# Patient Record
Sex: Male | Born: 1995 | Race: Black or African American | Hispanic: No
Health system: Southern US, Community
[De-identification: ages and names within clinical notes are randomized; demographics above are authoritative.]

## PROBLEM LIST (undated history)

## (undated) DIAGNOSIS — F419 Anxiety disorder, unspecified: Secondary | ICD-10-CM

## (undated) HISTORY — PX: NO PAST SURGERIES: SHX2092

---
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
## 2015-09-23 ENCOUNTER — Encounter: Payer: Self-pay | Admitting: Emergency Medicine

## 2015-09-23 ENCOUNTER — Emergency Department
Admission: EM | Admit: 2015-09-23 | Discharge: 2015-09-23 | Disposition: A | Discharge status: Home / Self Care | Payer: Self-pay | Attending: Emergency Medicine | Admitting: Emergency Medicine

## 2015-09-23 DIAGNOSIS — F17200 Nicotine dependence, unspecified, uncomplicated: Secondary | ICD-10-CM | POA: Insufficient documentation

## 2015-09-23 DIAGNOSIS — B349 Viral infection, unspecified: Secondary | ICD-10-CM | POA: Insufficient documentation

## 2015-09-23 LAB — POCT RAPID STREP A: STREPTOCOCCUS, GROUP A SCREEN (DIRECT): NEGATIVE

## 2015-09-23 LAB — MONONUCLEOSIS SCREEN: MONO SCREEN: NEGATIVE

## 2015-09-23 NOTE — ED Notes (Signed)
Chills  And body aches today  Afebrile on arrival to room.. Sore throat   Able to swallow well

## 2015-09-23 NOTE — ED Provider Notes (Signed)
[facility] Emergency Department Provider Note   ____________________________________________  Time seen: Approximately 5:39 PM  I have reviewed the triage vital signs and the nursing notes.   HISTORY  Chief Complaint Sore Throat   HPI Marcha Soldersyrone Macchia is a 20 y.o. male is here with complaint of sore throat that began last evening. Patient also states he had temperature the 100 last evening. His also had body aches and chills. He states that he is just not sure of fatigue but has "not felt like doing anything". He denies being around anyone with strep throat. He denies any upper respiratory symptoms, no nausea or vomiting, no diarrhea.He rates his pain as a 8 out of 10.   History reviewed. No pertinent past medical history.  There are no active problems to display for this patient.   History reviewed. No pertinent past surgical history.  No current outpatient prescriptions on file.  Allergies Review of patient's allergies indicates no known allergies.  No family history on file.  Social History Social History  Substance Use Topics  . Smoking status: Current Some Day Smoker  . Smokeless tobacco: None  . Alcohol Use: No    Review of Systems Constitutional: Positive fever/positive chills Eyes: No visual changes. ENT: Positive sore throat. Cardiovascular: Denies chest pain. Respiratory: Denies shortness of breath. Gastrointestinal: No abdominal pain.  No nausea, no vomiting.  No diarrhea.  No constipation. Genitourinary: Negative for dysuria. Musculoskeletal: Negative for back pain. Skin: Negative for rash. Neurological: Negative for headaches, focal weakness   10-point ROS otherwise negative.  ____________________________________________   PHYSICAL EXAM:  VITAL SIGNS: ED Triage Vitals  Enc Vitals Group     BP 09/23/15 1721 152/100 mmHg     Pulse Rate 09/23/15 1721 89     Resp 09/23/15 1721 20     Temp 09/23/15 1721 98.3 F (36.8  C)     Temp Source 09/23/15 1721 Oral     SpO2 09/23/15 1721 99 %     Weight 09/23/15 1721 168 lb (76.204 kg)     Height 09/23/15 1721 6\' 2"  (1.88 m)     Head Cir --      Peak Flow --      Pain Score 09/23/15 1723 8     Pain Loc --      Pain Edu? --      Excl. in GC? --     Constitutional: Alert and oriented. Well appearing and in no acute distress. Eyes: Conjunctivae are normal. PERRL. EOMI. Head: Atraumatic. Nose: No congestion/rhinnorhea.EACs and TMs are clear bilaterally. Mouth/Throat: Mucous membranes are moist.  Oropharynx Minimal erythema with right tonsil larger than the left. No exudate was noted. Neck: No stridor.   Hematological/Lymphatic/Immunilogical: Mild bilateral cervical lymphadenopathy. Cardiovascular: Normal rate, regular rhythm. Grossly normal heart sounds.  Good peripheral circulation. Respiratory: Normal respiratory effort.  No retractions. Lungs CTAB. Musculoskeletal: No lower extremity tenderness nor edema.  No joint effusions. Neurologic:  Normal speech and language. No gross focal neurologic deficits are appreciated. No gait instability. Skin:  Skin is warm, dry and intact. No rash noted. Psychiatric: Mood and affect are normal. Speech and behavior are normal.  ____________________________________________   LABS (all labs ordered are listed, but only abnormal results are displayed)  Labs Reviewed  MONONUCLEOSIS SCREEN  POCT RAPID STREP A     PROCEDURES  Procedure(s) performed: None  Critical Care performed: No  ____________________________________________   INITIAL IMPRESSION / ASSESSMENT AND PLAN / ED COURSE  Pertinent labs &  imaging results that were available during my care of the patient were reviewed by me and considered in my medical decision making (see chart for details).  A strep a mono test were negative. Patient was told this most likely is viral illness and to take ibuprofen or Tylenol as needed for fever and body aches. He is  to follow-up with [facility] clinic if any continued problems. ____________________________________________   FINAL CLINICAL IMPRESSION(S) / ED DIAGNOSES  Final diagnoses:  Viral illness      NEW MEDICATIONS STARTED DURING THIS VISIT:  New Prescriptions   No medications on file     Note:  This document was prepared using Dragon voice recognition software and may include unintentional dictation errors.    Tommi Rumps, PA-C 09/23/15 1853  Loleta Rose, MD 09/23/15 2113

## 2015-09-23 NOTE — ED Notes (Signed)
Chills and body aches and sore throat since yesterday.

## 2015-09-23 NOTE — Discharge Instructions (Signed)
Tylenol or ibuprofen if needed for fever or body aches. Increase fluids. Follow[facility] if any severe worsening of your problems or urgent concerns.

## 2017-03-08 ENCOUNTER — Emergency Department
Admission: EM | Admit: 2017-03-08 | Discharge: 2017-03-08 | Disposition: A | Discharge status: Home / Self Care | Payer: Self-pay | Attending: Emergency Medicine | Admitting: Emergency Medicine

## 2017-03-08 ENCOUNTER — Emergency Department: Payer: Self-pay

## 2017-03-08 DIAGNOSIS — F17200 Nicotine dependence, unspecified, uncomplicated: Secondary | ICD-10-CM | POA: Insufficient documentation

## 2017-03-08 DIAGNOSIS — R079 Chest pain, unspecified: Secondary | ICD-10-CM | POA: Insufficient documentation

## 2017-03-08 LAB — BASIC METABOLIC PANEL
ANION GAP: 9 (ref 5–15)
BUN: 15 mg/dL (ref 6–20)
CHLORIDE: 104 mmol/L (ref 101–111)
CO2: 25 mmol/L (ref 22–32)
Calcium: 10 mg/dL (ref 8.9–10.3)
Creatinine, Ser: 1.2 mg/dL (ref 0.61–1.24)
GFR calc Af Amer: >60 mL/min (ref >60)
GLUCOSE: 163 mg/dL — AB (ref 65–99)
POTASSIUM: 3.3 mmol/L — AB (ref 3.5–5.1)
Sodium: 138 mmol/L (ref 135–145)

## 2017-03-08 LAB — CBC
HEMATOCRIT: 46.6 % (ref 40.0–52.0)
HEMOGLOBIN: 15.6 g/dL (ref 13.0–18.0)
MCH: 29.7 pg (ref 26.0–34.0)
MCHC: 33.5 g/dL (ref 32.0–36.0)
MCV: 88.5 fL (ref 80.0–100.0)
Platelets: 293 10*3/uL (ref 150–440)
RBC: 5.26 MIL/uL (ref 4.40–5.90)
RDW: 13.5 % (ref 11.5–14.5)
WBC: 7.9 10*3/uL (ref 3.8–10.6)

## 2017-03-08 LAB — FIBRIN DERIVATIVES D-DIMER (ARMC ONLY): FIBRIN DERIVATIVES D-DIMER (ARMC): 83.55 ng{FEU}/mL (ref 0.00–499.00)

## 2017-03-08 LAB — TROPONIN I: Troponin I: <0.03 ng/mL (ref <0.03)

## 2017-03-08 NOTE — ED Notes (Signed)
ED Provider at bedside. 

## 2017-03-08 NOTE — ED Notes (Signed)
Pt states "vein in my hand was sore yesterday, today the vein in my neck hurts"

## 2017-03-08 NOTE — ED Triage Notes (Signed)
Left sided intermittent chest pain and sharpness for the last month. No pain at this time. Pain did occur earlier today. Pt alert and oriented X4, active, cooperative, pt in NAD. RR even and unlabored, color WNL.

## 2017-03-08 NOTE — ED Provider Notes (Signed)
[facility] Emergency Department Provider Note  ____________________________________________   First MD Initiated Contact with Patient 03/08/17 1932     (approximate)  I have reviewed the triage vital signs and the nursing notes.   HISTORY  Chief Complaint Chest Pain   HPI Jeremy Kim is a 21 y.o. male without any chronic medical conditions was presenting with at least 1 month of chest pain. He says the chest pain is left-sided and "grabs him." He says that the pain lasts for about 30 seconds and then goes away. He sometimes experiences shortness of breath with pain. He says that he has not done any more having lifting than his normal routine which is in [employer]. He says he lifts 30 pound windows and has not done anything heavier than this in the recent past. He denies any history of blood clots. Says his pain is minimal at this time. Denies any worsening factors such as movement or exertion. Denies any radiation of the pain. However, he does have that the pain sometimes on the right side of his chest as well.   History reviewed. No pertinent past medical history.  There are no active problems to display for this patient.   History reviewed. No pertinent surgical history.  Prior to Admission medications   Not on File    Allergies Patient has no known allergies.  No family history on file.  Social History Social History  Substance Use Topics  . Smoking status: Current Some Day Smoker  . Smokeless tobacco: Not on file  . Alcohol use No    Review of Systems  Constitutional: No fever/chills Eyes: No visual changes. ENT: No sore throat. Cardiovascular:as above Respiratory: as above Gastrointestinal: No abdominal pain.  No nausea, no vomiting.  No diarrhea.  No constipation. Genitourinary: Negative for dysuria. Musculoskeletal: Negative for back pain. Skin: Negative for rash. Neurological: Negative for headaches, focal weakness or  numbness.   ____________________________________________   PHYSICAL EXAM:  VITAL SIGNS: ED Triage Vitals  Enc Vitals Group     BP 03/08/17 1752 130/75     Pulse Rate 03/08/17 1752 (!) 109     Resp 03/08/17 1752 16     Temp 03/08/17 1752 98.8 F (37.1 C)     Temp Source 03/08/17 1752 Oral     SpO2 03/08/17 1752 98 %     Weight 03/08/17 1753 180 lb (81.6 kg)     Height 03/08/17 1753 6' (1.829 m)     Head Circumference --      Peak Flow --      Pain Score 03/08/17 1935 3     Pain Loc --      Pain Edu? --      Excl. in GC? --     Constitutional: Alert and oriented. Well appearing and in no acute distress. Eyes: Conjunctivae are normal.  Head: Atraumatic. Nose: No congestion/rhinnorhea. Mouth/Throat: Mucous membranes are moist.  Neck: No stridor.   Cardiovascular: Normal rate, regular rhythm. Grossly normal heart sounds.  Good peripheral circulation with intact equal and bilateral radial as well as dorsalis pedis pulses. Chest pain is not reproducible to palpation. Respiratory: Normal respiratory effort.  No retractions. Lungs CTAB. Gastrointestinal: Soft and nontender. No distention. Musculoskeletal: No lower extremity tenderness nor edema.  No joint effusions. Neurologic:  Normal speech and language. No gross focal neurologic deficits are appreciated. Skin:  Skin is warm, dry and intact. No rash noted. Psychiatric: Mood and affect are normal. Speech and behavior are  normal.  ____________________________________________   LABS (all labs ordered are listed, but only abnormal results are displayed)  Labs Reviewed  BASIC METABOLIC PANEL - Abnormal; Notable for the following:       Result Value   Potassium 3.3 (*)    Glucose, Bld 163 (*)    All other components within normal limits  CBC  TROPONIN I  FIBRIN DERIVATIVES D-DIMER ([facility] ONLY)   ____________________________________________  EKG  ED ECG REPORT I, Arelia Longest, the attending physician, personally  viewed and interpreted this ECG.   Date: 03/08/2017  EKG Time: 1749  Rate: 78  Rhythm: normal sinus rhythm  Axis: Normal  Intervals:none  ST&T Change: no ST segment elevation or depression. No abnormal T-wave inversion.  ____________________________________________  RADIOLOGY  no acute processes on the chest x-ray. ____________________________________________   PROCEDURES  Procedure(s) performed:   Procedures  Critical Care performed:   ____________________________________________   INITIAL IMPRESSION / ASSESSMENT AND PLAN / ED COURSE  Pertinent labs & imaging results that were available during my care of the patient were reviewed by me and considered in my medical decision making (see chart for details).  Differential diagnosis includes, but is not limited to, ACS, aortic dissection, pulmonary embolism, cardiac tamponade, pneumothorax, pneumonia, pericarditis/myocarditis, GI-related causes including esophagitis/gastritis, and musculoskeletal chest wall pain.    As part of my medical decision making, I reviewed the following data within the electronic MEDICAL RECORD NUMBER Notes from prior ED visits     ----------------------------------------- 9:17 PM on 03/08/2017 -----------------------------------------  Patient without any distress at this time. Resting comfortably without any complaints. Likely musculoskeletal issue. 1 month of symptoms with very reassuring lab work and presentation. Discussed with the patient using ibuprofen, Tylenol as well as topical salve such as icy hot. Also discussed stretching the chest muscles. He is understanding the plan and willing to comply.  ____________________________________________   FINAL CLINICAL IMPRESSION(S) / ED DIAGNOSES  chest pain.    NEW MEDICATIONS STARTED DURING THIS VISIT:  New Prescriptions   No medications on file     Note:  This document was prepared using Dragon voice recognition software and may include  unintentional dictation errors.     Myrna Blazer, MD 03/08/17 2117

## 2017-03-10 ENCOUNTER — Emergency Department
Admission: EM | Admit: 2017-03-10 | Discharge: 2017-03-10 | Disposition: A | Discharge status: Home / Self Care | Payer: Self-pay | Attending: Emergency Medicine | Admitting: Emergency Medicine

## 2017-03-10 ENCOUNTER — Encounter: Payer: Self-pay | Admitting: Emergency Medicine

## 2017-03-10 ENCOUNTER — Other Ambulatory Visit: Payer: Self-pay

## 2017-03-10 DIAGNOSIS — R69 Illness, unspecified: Secondary | ICD-10-CM | POA: Insufficient documentation

## 2017-03-10 DIAGNOSIS — J111 Influenza due to unidentified influenza virus with other respiratory manifestations: Secondary | ICD-10-CM

## 2017-03-10 DIAGNOSIS — F17210 Nicotine dependence, cigarettes, uncomplicated: Secondary | ICD-10-CM | POA: Insufficient documentation

## 2017-03-10 DIAGNOSIS — J039 Acute tonsillitis, unspecified: Secondary | ICD-10-CM | POA: Insufficient documentation

## 2017-03-10 LAB — CBC
HCT: 45.2 % (ref 40.0–52.0)
HEMOGLOBIN: 14.9 g/dL (ref 13.0–18.0)
MCH: 29.1 pg (ref 26.0–34.0)
MCHC: 33 g/dL (ref 32.0–36.0)
MCV: 88.3 fL (ref 80.0–100.0)
PLATELETS: 272 10*3/uL (ref 150–440)
RBC: 5.12 MIL/uL (ref 4.40–5.90)
RDW: 13.7 % (ref 11.5–14.5)
WBC: 23.7 10*3/uL — ABNORMAL HIGH (ref 3.8–10.6)

## 2017-03-10 LAB — COMPREHENSIVE METABOLIC PANEL
ALBUMIN: 4.5 g/dL (ref 3.5–5.0)
ALK PHOS: 49 U/L (ref 38–126)
ALT: 15 U/L — ABNORMAL LOW (ref 17–63)
ANION GAP: 9 (ref 5–15)
AST: 29 U/L (ref 15–41)
BILIRUBIN TOTAL: 1.3 mg/dL — AB (ref 0.3–1.2)
BUN: 11 mg/dL (ref 6–20)
CALCIUM: 9.5 mg/dL (ref 8.9–10.3)
CO2: 24 mmol/L (ref 22–32)
CREATININE: 1.22 mg/dL (ref 0.61–1.24)
Chloride: 105 mmol/L (ref 101–111)
GFR calc non Af Amer: >60 mL/min (ref >60)
GLUCOSE: 110 mg/dL — AB (ref 65–99)
Potassium: 3.7 mmol/L (ref 3.5–5.1)
Sodium: 138 mmol/L (ref 135–145)
TOTAL PROTEIN: 7.7 g/dL (ref 6.5–8.1)

## 2017-03-10 LAB — INFLUENZA PANEL BY PCR (TYPE A & B)
INFLBPCR: NEGATIVE
Influenza A By PCR: NEGATIVE

## 2017-03-10 LAB — MONONUCLEOSIS SCREEN: Mono Screen: NEGATIVE

## 2017-03-10 LAB — TYPE AND SCREEN
ABO/RH(D): A POS
ANTIBODY SCREEN: NEGATIVE

## 2017-03-10 LAB — POCT RAPID STREP A: STREPTOCOCCUS, GROUP A SCREEN (DIRECT): NEGATIVE

## 2017-03-10 MED ORDER — NAPROXEN 500 MG PO TABS: 500.0000 mg | ORAL_TABLET | Freq: Two times a day (BID) | ORAL | 0 refills | Status: DC

## 2017-03-10 MED ORDER — DEXAMETHASONE SODIUM PHOSPHATE 10 MG/ML IJ SOLN
10.0000 mg | Freq: Once | INTRAMUSCULAR | Status: AC
  Administered 2017-03-10: 10 mg via INTRAVENOUS
  Filled 2017-03-10: qty 1

## 2017-03-10 MED ORDER — SODIUM CHLORIDE 0.9 % IV BOLUS (SEPSIS)
1000.0000 mL | Freq: Once | INTRAVENOUS | Status: AC
  Administered 2017-03-10: 1000 mL via INTRAVENOUS

## 2017-03-10 MED ORDER — KETOROLAC TROMETHAMINE 30 MG/ML IJ SOLN
15.0000 mg | INTRAMUSCULAR | Status: AC
Start: 2017-03-10 — End: 2017-03-10
  Administered 2017-03-10: 15 mg via INTRAVENOUS
  Filled 2017-03-10: qty 1

## 2017-03-10 NOTE — Discharge Instructions (Signed)
Your strep, mono, and flu tests were all negative.  This is most likely another viral illness.  If your symptoms worsen or you are unable to drink fluids, return to the ER for repeat evaluation.

## 2017-03-10 NOTE — ED Provider Notes (Signed)
[facility] Emergency Department Provider Note  ____________________________________________  Time seen: Approximately 2:24 PM  I have reviewed the triage vital signs and the nursing notes.   HISTORY  Chief Complaint Hemoptysis; Back Pain; and Sore Throat    HPI Jeremy Kim is a 21 y.o. male reports having a sore throat for the past 2 days. Initially he was having some discomfort in his chest, and then last night his symptoms became more per dominantly sore throat. Also reports body aches and fatigue. No fevers but he did have chills yesterday. Denies any medical history allergies medications or prior surgeries.  This morning after brushing his teeth, he is trying to clear his throat and he spit out some blood tinged sputum a few times and one single episode. Denies any cough whatsoever. Denies any vomiting. Denies any other bleeding. No epistaxis. No dizziness or syncope.  he is around small children at home on a regular basis.   History reviewed. No pertinent past medical history.   There are no active problems to display for this patient.    History reviewed. No pertinent surgical history.   Prior to Admission medications   Medication Sig Start Date End Date Taking? Authorizing Provider  naproxen (NAPROSYN) 500 MG tablet Take 1 tablet (500 mg total) by mouth 2 (two) times daily with a meal. 03/10/17   Sharman CheekStafford, Kyung Muto, MD  none   Allergies Patient has no known allergies.   History reviewed. No pertinent family history.  Social History Social History  Substance Use Topics  . Smoking status: Current Some Day Smoker    Types: Cigarettes  . Smokeless tobacco: Never Used     Comment: 1 or 2 per day  . Alcohol use No    Review of Systems  Constitutional:   No fever positive chills.  ENT:   Positive sore throat. No rhinorrhea. Cardiovascular:   No chest pain or syncope. Respiratory:   No dyspnea or cough. Gastrointestinal:   Negative  for abdominal pain, vomiting and diarrhea.  Musculoskeletal:   positive body aches, most predominant in the lower back. All other systems reviewed and are negative except as documented above in ROS and HPI.  ____________________________________________   PHYSICAL EXAM:  VITAL SIGNS: ED Triage Vitals  Enc Vitals Group     BP 03/10/17 1215 130/83     Pulse Rate 03/10/17 1215 (!) 108     Resp 03/10/17 1215 14     Temp 03/10/17 1215 99.2 F (37.3 C)     Temp Source 03/10/17 1215 Oral     SpO2 03/10/17 1215 98 %     Weight 03/10/17 1215 180 lb (81.6 kg)     Height 03/10/17 1215 6' (1.829 m)     Head Circumference --      Peak Flow --      Pain Score 03/10/17 1224 7     Pain Loc --      Pain Edu? --      Excl. in GC? --     Vital signs reviewed, nursing assessments reviewed.   Constitutional:   Alert and oriented. Well appearing and in no distress. Eyes:   No scleral icterus.  EOMI. No nystagmus. No conjunctival pallor. PERRL. ENT   Head:   Normocephalic and atraumatic.   Nose:   No congestion/rhinnorhea.    Mouth/Throat:   MMM, positive pharyngeal erythema. Bilateral swollen tonsils with exudates. No peritonsillar mass or uvular deviation.    Neck:   No meningismus. Full  ROM.no midline tenderness Hematological/Lymphatic/Immunilogical:   large bilateral cervical lymphadenopathy. Cardiovascular:   tachycardia heart rate 110. Symmetric bilateral radial and DP pulses.  No murmurs.  Respiratory:   Normal respiratory effort without tachypnea/retractions. Breath sounds are clear and equal bilaterally. No wheezes/rales/rhonchi. Gastrointestinal:   Soft and nontender. Non distended. There is no CVA tenderness.  No rebound, rigidity, or guarding. Genitourinary:   deferred Musculoskeletal:   Normal range of motion in all extremities. No joint effusions.  No lower extremity tenderness.  No edema.no midline spinal tenderness. Neurologic:   Normal speech and language.  Motor  grossly intact. No gross focal neurologic deficits are appreciated.  Skin:    Skin is warm, dry and intact. No rash noted.  No petechiae, purpura, or bullae.  ____________________________________________    LABS (pertinent positives/negatives) (all labs ordered are listed, but only abnormal results are displayed) Labs Reviewed  COMPREHENSIVE METABOLIC PANEL - Abnormal; Notable for the following:       Result Value   Glucose, Bld 110 (*)    ALT 15 (*)    Total Bilirubin 1.3 (*)    All other components within normal limits  CBC - Abnormal; Notable for the following:    WBC 23.7 (*)    All other components within normal limits  CULTURE, GROUP A STREP ([facility])  INFLUENZA PANEL BY PCR (TYPE A & B)  MONONUCLEOSIS SCREEN  POC OCCULT BLOOD, ED  TYPE AND SCREEN   ____________________________________________   EKG  interpreted by me Normal sinus rhythm rate of 96, normal axis and intervals. Normal QRS ST segments and T waves. Incomplete right bundle-branch block.  ____________________________________________    RADIOLOGY  No results found.  ____________________________________________   PROCEDURES Procedures  ____________________________________________   DIFFERENTIAL DIAGNOSIS  mononucleosis, strep pharyngitis, influenza, viral URI  CLINICAL IMPRESSION / ASSESSMENT AND PLAN / ED COURSE  Pertinent labs & imaging results that were available during my care of the patient were reviewed by me and considered in my medical decision making (see chart for details).   no evidence of epiglottitis, bacterial tracheitis, peritonsillar abscess, retropharyngeal abscess. Nothing to suggest PE sepsis meningitis encephalitis. Patient is overall well appearing nontoxic, unremarkable vital signs except for mild tachycardia. Given IV fluids, Decadron, Toradol. Check mono, flu, strep culture.      ----------------------------------------- 3:06 PM on  03/10/2017 -----------------------------------------  Heart rate improved to 95. Patient is tolerating oral intake, calm comfortable smiling and energetic. He was to go home. Workup is negative except for his leukocytosis. Return precautions discussed for any worsening of his condition but particularly any difficulty tolerating oral intake or fever or focal pain. He is not a risk of epidural abscess or discitis or osteomyelitis of the spine. Follow-up with primary care.  ____________________________________________   FINAL CLINICAL IMPRESSION(S) / ED DIAGNOSES    Final diagnoses:  Tonsillitis  Influenza-like illness      New Prescriptions   NAPROXEN (NAPROSYN) 500 MG TABLET    Take 1 tablet (500 mg total) by mouth 2 (two) times daily with a meal.     Portions of this note were generated with dragon dictation software. Dictation errors may occur despite best attempts at proofreading.    Sharman Cheek, MD 03/10/17 [phone]

## 2017-03-10 NOTE — ED Triage Notes (Signed)
Pt via pov from home with "spitting up blood" x 3 this morning, chills, and lower back pain. Pt states he broke his back last year and "sometimes" has pain. He states he has tingling from waist down. Denies urinary symptoms or incontinence. Pt states he has had a sore throat since last night as well. Pt alert & oriented with NAD  Noted.

## 2017-03-13 LAB — CULTURE, GROUP A STREP (THRC)

## 2017-04-10 ENCOUNTER — Other Ambulatory Visit: Payer: Self-pay

## 2017-04-10 ENCOUNTER — Emergency Department
Admission: EM | Admit: 2017-04-10 | Discharge: 2017-04-10 | Disposition: A | Discharge status: Home / Self Care | Payer: Self-pay | Attending: Emergency Medicine | Admitting: Emergency Medicine

## 2017-04-10 ENCOUNTER — Emergency Department: Payer: Self-pay

## 2017-04-10 ENCOUNTER — Encounter: Payer: Self-pay | Admitting: Emergency Medicine

## 2017-04-10 DIAGNOSIS — Y9389 Activity, other specified: Secondary | ICD-10-CM | POA: Insufficient documentation

## 2017-04-10 DIAGNOSIS — Y929 Unspecified place or not applicable: Secondary | ICD-10-CM | POA: Insufficient documentation

## 2017-04-10 DIAGNOSIS — S60221A Contusion of right hand, initial encounter: Secondary | ICD-10-CM | POA: Insufficient documentation

## 2017-04-10 DIAGNOSIS — Z79899 Other long term (current) drug therapy: Secondary | ICD-10-CM | POA: Insufficient documentation

## 2017-04-10 DIAGNOSIS — X58XXXA Exposure to other specified factors, initial encounter: Secondary | ICD-10-CM | POA: Insufficient documentation

## 2017-04-10 DIAGNOSIS — Y999 Unspecified external cause status: Secondary | ICD-10-CM | POA: Insufficient documentation

## 2017-04-10 DIAGNOSIS — F17210 Nicotine dependence, cigarettes, uncomplicated: Secondary | ICD-10-CM | POA: Insufficient documentation

## 2017-04-10 MED ORDER — TRAMADOL HCL 50 MG PO TABS: 50.0000 mg | ORAL_TABLET | Freq: Four times a day (QID) | ORAL | 0 refills | Status: DC | PRN

## 2017-04-10 MED ORDER — IBUPROFEN 600 MG PO TABS: 600.0000 mg | ORAL_TABLET | Freq: Three times a day (TID) | ORAL | 0 refills | Status: DC | PRN

## 2017-04-10 NOTE — Discharge Instructions (Signed)
Hand splint for 2-3 days as needed.

## 2017-04-10 NOTE — ED Provider Notes (Signed)
[facility] Emergency Department Provider Note   ____________________________________________   First MD Initiated Contact with Patient 04/10/17 1616     (approximate)  I have reviewed the triage vital signs and the nursing notes.   HISTORY  Chief Complaint Hand Pain    HPI Marcha Soldersyrone Violett is a 21 y.o. male patient complaining of right hand pain secondary to hitting a wall last night.Patient rates pain as 8/10. Patient described a pain as "acute achy". No palliative measures for her complaint. Patient is right-hand dominant.   History reviewed. No pertinent past medical history.  There are no active problems to display for this patient.   History reviewed. No pertinent surgical history.  Prior to Admission medications   Medication Sig Start Date End Date Taking? Authorizing Provider  ibuprofen (ADVIL,MOTRIN) 600 MG tablet Take 1 tablet (600 mg total) every 8 (eight) hours as needed by mouth. 04/10/17   Joni ReiningSmith, Zelma Mazariego K, PA-C  naproxen (NAPROSYN) 500 MG tablet Take 1 tablet (500 mg total) by mouth 2 (two) times daily with a meal. 03/10/17   Sharman CheekStafford, Phillip, MD  traMADol (ULTRAM) 50 MG tablet Take 1 tablet (50 mg total) every 6 (six) hours as needed by mouth for moderate pain. 04/10/17   Joni ReiningSmith, Kama Cammarano K, PA-C    Allergies Patient has no known allergies.  No family history on file.  Social History Social History   Tobacco Use  . Smoking status: Current Some Day Smoker    Types: Cigarettes  . Smokeless tobacco: Never Used  . Tobacco comment: 1 or 2 per day  Substance Use Topics  . Alcohol use: No  . Drug use: No    Review of Systems Constitutional: No fever/chills Eyes: No visual changes. ENT: No sore throat. Cardiovascular: Denies chest pain. Respiratory: Denies shortness of breath. Gastrointestinal: No abdominal pain.  No nausea, no vomiting.  No diarrhea.  No constipation. Genitourinary: Negative for dysuria. Musculoskeletal: Right  hand pain  Skin: Negative for rash. Neurological: Negative for headaches, focal weakness or numbness.   ____________________________________________   PHYSICAL EXAM:  VITAL SIGNS: ED Triage Vitals  Enc Vitals Group     BP 04/10/17 1552 135/74     Pulse Rate 04/10/17 1552 98     Resp 04/10/17 1552 20     Temp 04/10/17 1552 98.4 F (36.9 C)     Temp Source 04/10/17 1552 Oral     SpO2 04/10/17 1552 99 %     Weight 04/10/17 1555 186 lb (84.4 kg)     Height 04/10/17 1555 6' (1.829 m)     Head Circumference --      Peak Flow --      Pain Score 04/10/17 1552 8     Pain Loc --      Pain Edu? --      Excl. in GC? --     Constitutional: Alert and oriented. Well appearing and in no acute distress. Cardiovascular: Normal rate, regular rhythm. Grossly normal heart sounds.  Good peripheral circulation. Respiratory: Normal respiratory effort.  No retractions. Lungs CTAB. Gastrointestinal: Soft and nontender. No distention. No abdominal bruits. No CVA tenderness. Musculoskeletal: No obvious deformity to the right hand. It is moderate guarding palpation of the third to fifth metacarpal.  Neurologic:  Normal speech and language. No gross focal neurologic deficits are appreciated. No gait instability. Skin:  Skin is warm, dry and intact. No rash noted. Psychiatric: Mood and affect are normal. Speech and behavior are normal.  ____________________________________________  LABS (all labs ordered are listed, but only abnormal results are displayed)  Labs Reviewed - No data to display ____________________________________________  EKG   ____________________________________________  RADIOLOGY  Dg Hand Complete Right  Result Date: 04/10/2017 CLINICAL DATA:  Patient reports punching a wall today. Reports pain to medial surface of right hand and medial and posterior surfaces of right wrist. No previous injuries to right hand or wrist. EXAM: RIGHT HAND - COMPLETE 3+ VIEW COMPARISON:   None. FINDINGS: Osseous alignment is normal. Bone mineralization is normal. No fracture line or acute fracture fragment identified. Adjacent soft tissues are unremarkable. IMPRESSION: Negative. Electronically Signed   By: Bary RichardStan  Maynard M.D.   On: 04/10/2017 16:17    ____________________________________________   PROCEDURES  Procedure(s) performed: None  Procedures  Critical Care performed: No  ____________________________________________   INITIAL IMPRESSION / ASSESSMENT AND PLAN / ED COURSE  As part of my medical decision making, I reviewed the following data within the electronic MEDICAL RECORD NUMBER    Right hand pain secondary to contusion. Discussed negative x-ray findings with patient. Patient given discharge Instructions. Patient placed in a volar splint and advised take medication as directed. Patient given a work no. Patient advised to follow-up with the "clinic if condition persists.      ____________________________________________   FINAL CLINICAL IMPRESSION(S) / ED DIAGNOSES  Final diagnoses:  Contusion of right hand, initial encounter     ED Discharge Orders        Ordered    ibuprofen (ADVIL,MOTRIN) 600 MG tablet  Every 8 hours PRN     04/10/17 1630    traMADol (ULTRAM) 50 MG tablet  Every 6 hours PRN     04/10/17 1630       Note:  This document was prepared using Dragon voice recognition software and may include unintentional dictation errors.    Joni ReiningSmith, Careena Degraffenreid K, PA-C 04/10/17 1631    Jeanmarie PlantMcShane, James A, MD 04/10/17 2100

## 2017-04-10 NOTE — ED Triage Notes (Signed)
R hand pain. States injured it hitting a wall last night.

## 2017-10-14 ENCOUNTER — Encounter: Payer: Self-pay | Admitting: Emergency Medicine

## 2017-10-14 ENCOUNTER — Other Ambulatory Visit: Payer: Self-pay

## 2017-10-14 ENCOUNTER — Emergency Department
Admission: EM | Admit: 2017-10-14 | Discharge: 2017-10-14 | Disposition: A | Discharge status: Home / Self Care | Payer: Self-pay | Attending: Emergency Medicine | Admitting: Emergency Medicine

## 2017-10-14 DIAGNOSIS — F17210 Nicotine dependence, cigarettes, uncomplicated: Secondary | ICD-10-CM | POA: Insufficient documentation

## 2017-10-14 DIAGNOSIS — J029 Acute pharyngitis, unspecified: Secondary | ICD-10-CM

## 2017-10-14 MED ORDER — LIDOCAINE VISCOUS HCL 2 % MT SOLN: 10.0000 mL | OROMUCOSAL | 0 refills | Status: DC | PRN

## 2017-10-14 MED ORDER — AMOXICILLIN-POT CLAVULANATE 875-125 MG PO TABS: 1.0000 | ORAL_TABLET | Freq: Two times a day (BID) | ORAL | 0 refills | Status: AC

## 2017-10-14 MED ORDER — PREDNISONE 10 MG PO TABS: ORAL_TABLET | ORAL | 0 refills | Status: DC

## 2017-10-14 MED ORDER — DEXAMETHASONE SODIUM PHOSPHATE 10 MG/ML IJ SOLN
10.0000 mg | Freq: Once | INTRAMUSCULAR | Status: AC
  Administered 2017-10-14: 10 mg via INTRAMUSCULAR
  Filled 2017-10-14: qty 1

## 2017-10-14 MED ORDER — AMOXICILLIN-POT CLAVULANATE 875-125 MG PO TABS
1.0000 | ORAL_TABLET | Freq: Once | ORAL | Status: AC
  Administered 2017-10-14: 1 via ORAL
  Filled 2017-10-14: qty 1

## 2017-10-14 NOTE — ED Triage Notes (Signed)
Pt reports that he developed a sore throat last night reports that it hurts to swallow. He is able to swallow his saliva.

## 2017-10-14 NOTE — ED Provider Notes (Signed)
[facility] Emergency Department Provider Note  ____________________________________________  Time seen: Approximately 12:40 PM  I have reviewed the triage vital signs and the nursing notes.   HISTORY  Chief Complaint Sore Throat    HPI Jeremy Kim is a 22 y.o. male that presents to the emergency department for evaluation of sore throat for 1 day.  Patient states that it is painful to swallow.  No sick contacts.  No fever, chills, nausea, vomiting.   History reviewed. No pertinent past medical history.  There are no active problems to display for this patient.   History reviewed. No pertinent surgical history.  Prior to Admission medications   Medication Sig Start Date End Date Taking? Authorizing Provider  amoxicillin-clavulanate (AUGMENTIN) 875-125 MG tablet Take 1 tablet by mouth 2 (two) times daily for 10 days. 10/14/17 10/24/17  Enid Derry, PA-C  ibuprofen (ADVIL,MOTRIN) 600 MG tablet Take 1 tablet (600 mg total) every 8 (eight) hours as needed by mouth. 04/10/17   Joni Reining, PA-C  lidocaine (XYLOCAINE) 2 % solution Use as directed 10 mLs in the mouth or throat as needed for mouth pain. 10/14/17   Enid Derry, PA-C  naproxen (NAPROSYN) 500 MG tablet Take 1 tablet (500 mg total) by mouth 2 (two) times daily with a meal. 03/10/17   Sharman Cheek, MD  predniSONE (DELTASONE) 10 MG tablet Take 6 tablets on day 1, take 5 tablets on day 2, take 4 tablets on day 3, take 3 tablets on day 4, take 2 tablets on day 5, take 1 tablet on day 6 10/14/17   Enid Derry, PA-C  traMADol (ULTRAM) 50 MG tablet Take 1 tablet (50 mg total) every 6 (six) hours as needed by mouth for moderate pain. 04/10/17   Joni Reining, PA-C    Allergies Patient has no known allergies.  History reviewed. No pertinent family history.  Social History Social History   Tobacco Use  . Smoking status: Current Some Day Smoker    Types: Cigarettes  . Smokeless tobacco:  Never Used  . Tobacco comment: 1 or 2 per day  Substance Use Topics  . Alcohol use: No  . Drug use: No     Review of Systems  Constitutional: No fever/chills Eyes: No visual changes. No discharge. ENT: Negative for congestion and rhinorrhea. Cardiovascular: No chest pain. Respiratory: Negative for cough. No SOB. Gastrointestinal: No abdominal pain.  No nausea, no vomiting.  Musculoskeletal: Negative for musculoskeletal pain. Skin: Negative for rash, abrasions, lacerations, ecchymosis. Neurological: Negative for headaches.   ____________________________________________   PHYSICAL EXAM:  VITAL SIGNS: ED Triage Vitals  Enc Vitals Group     BP 10/14/17 1145 (!) 150/70     Pulse Rate 10/14/17 1145 96     Resp 10/14/17 1145 18     Temp 10/14/17 1145 98.6 F (37 C)     Temp Source 10/14/17 1145 Oral     SpO2 10/14/17 1145 100 %     Weight 10/14/17 1146 180 lb (81.6 kg)     Height 10/14/17 1146  (1.88 m)     Head Circumference --      Peak Flow --      Pain Score 10/14/17 1145 10     Pain Loc --      Pain Edu? --      Excl. in GC? --      Constitutional: Alert and oriented. Well appearing and in no acute distress. Eyes: Conjunctivae are normal. PERRL. EOMI. No  discharge. Head: Atraumatic. ENT: No frontal and maxillary sinus tenderness.      Ears: Tympanic membranes pearly gray with good landmarks. No discharge.      Nose: No congestion/rhinnorhea.      Mouth/Throat: Mucous membranes are moist. Oropharynx erythematous. Tonsils not enlarged. No exudates. Uvula midline and enlarged. Neck: No stridor.   Hematological/Lymphatic/Immunilogical: No cervical lymphadenopathy. Cardiovascular: Normal rate, regular rhythm.  Good peripheral circulation. Respiratory: Normal respiratory effort without tachypnea or retractions. Lungs CTAB. Good air entry to the bases with no decreased or absent breath sounds. Gastrointestinal: Bowel sounds 4 quadrants. Soft and nontender to  palpation. No guarding or rigidity. No palpable masses. No distention. Musculoskeletal: Full range of motion to all extremities. No gross deformities appreciated. Neurologic:  Normal speech and language. No gross focal neurologic deficits are appreciated.  Skin:  Skin is warm, dry and intact. No rash noted. Psychiatric: Mood and affect are normal. Speech and behavior are normal. Patient exhibits appropriate insight and judgement.   ____________________________________________   LABS (all labs ordered are listed, but only abnormal results are displayed)  Labs Reviewed - No data to display ____________________________________________  EKG   ____________________________________________  RADIOLOGY  No results found.  ____________________________________________    PROCEDURES  Procedure(s) performed:    Procedures    Medications  dexamethasone (DECADRON) injection 10 mg (10 mg Intramuscular Given 10/14/17 1254)  amoxicillin-clavulanate (AUGMENTIN) 875-125 MG per tablet 1 tablet (1 tablet Oral Given 10/14/17 1254)     ____________________________________________   INITIAL IMPRESSION / ASSESSMENT AND PLAN / ED COURSE  Pertinent labs & imaging results that were available during my care of the patient were reviewed by me and considered in my medical decision making (see chart for details).  Review of the [place] CSRS was performed in accordance of the [facility] prior to dispensing any controlled drugs.     Patient's diagnosis is consistent with pharyngitis. Vital signs and exam are reassuring. Patient appears well and is staying well hydrated. Patient should alternate tylenol and ibuprofen for fever. Patient feels comfortable going home. Patient will be discharged home with prescriptions for prednisone and Augmentin. Patient is to follow up with PCP as needed or otherwise directed. Patient is given ED precautions to return to the ED for any worsening or new  symptoms.     ____________________________________________  FINAL CLINICAL IMPRESSION(S) / ED DIAGNOSES  Final diagnoses:  Pharyngitis, unspecified etiology      NEW MEDICATIONS STARTED DURING THIS VISIT:  ED Discharge Orders        Ordered    amoxicillin-clavulanate (AUGMENTIN) 875-125 MG tablet  2 times daily     10/14/17 1308    predniSONE (DELTASONE) 10 MG tablet     10/14/17 1308    lidocaine (XYLOCAINE) 2 % solution  As needed     10/14/17 1326          This chart was dictated using voice recognition software/Dragon. Despite best efforts to proofread, errors can occur which can change the meaning. Any change was purely unintentional.    Enid Derry, PA-C 10/14/17 1543    Don Perking, [place], MD 11/01/17 [phone]

## 2017-10-14 NOTE — ED Notes (Signed)
See triage note  States he woke up with swelling to back of throat thia am  No fever  Had to swallow  afebrile on arrival

## 2018-02-12 ENCOUNTER — Other Ambulatory Visit: Payer: Self-pay

## 2018-02-12 ENCOUNTER — Emergency Department: Payer: Self-pay

## 2018-02-12 ENCOUNTER — Encounter: Payer: Self-pay | Admitting: Emergency Medicine

## 2018-02-12 ENCOUNTER — Emergency Department
Admission: EM | Admit: 2018-02-12 | Discharge: 2018-02-13 | Disposition: A | Discharge status: Home / Self Care | Payer: Self-pay | Attending: Emergency Medicine | Admitting: Emergency Medicine

## 2018-02-12 DIAGNOSIS — R079 Chest pain, unspecified: Secondary | ICD-10-CM | POA: Insufficient documentation

## 2018-02-12 DIAGNOSIS — L03116 Cellulitis of left lower limb: Secondary | ICD-10-CM | POA: Insufficient documentation

## 2018-02-12 DIAGNOSIS — W57XXXA Bitten or stung by nonvenomous insect and other nonvenomous arthropods, initial encounter: Secondary | ICD-10-CM

## 2018-02-12 DIAGNOSIS — L03115 Cellulitis of right lower limb: Secondary | ICD-10-CM | POA: Insufficient documentation

## 2018-02-12 DIAGNOSIS — L03119 Cellulitis of unspecified part of limb: Secondary | ICD-10-CM

## 2018-02-12 DIAGNOSIS — F17210 Nicotine dependence, cigarettes, uncomplicated: Secondary | ICD-10-CM | POA: Insufficient documentation

## 2018-02-12 LAB — CBC
HEMATOCRIT: 43.9 % (ref 40.0–52.0)
HEMOGLOBIN: 15.1 g/dL (ref 13.0–18.0)
MCH: 30.7 pg (ref 26.0–34.0)
MCHC: 34.3 g/dL (ref 32.0–36.0)
MCV: 89.4 fL (ref 80.0–100.0)
Platelets: 323 10*3/uL (ref 150–440)
RBC: 4.91 MIL/uL (ref 4.40–5.90)
RDW: 14.5 % (ref 11.5–14.5)
WBC: 14.9 10*3/uL — AB (ref 3.8–10.6)

## 2018-02-12 LAB — BASIC METABOLIC PANEL
ANION GAP: 8 (ref 5–15)
BUN: 13 mg/dL (ref 6–20)
CHLORIDE: 104 mmol/L (ref 98–111)
CO2: 27 mmol/L (ref 22–32)
Calcium: 9.7 mg/dL (ref 8.9–10.3)
Creatinine, Ser: 1.26 mg/dL — ABNORMAL HIGH (ref 0.61–1.24)
Glucose, Bld: 103 mg/dL — ABNORMAL HIGH (ref 70–99)
POTASSIUM: 3.7 mmol/L (ref 3.5–5.1)
SODIUM: 139 mmol/L (ref 135–145)

## 2018-02-12 LAB — TROPONIN I: Troponin I: <0.03 ng/mL (ref <0.03)

## 2018-02-12 NOTE — ED Triage Notes (Signed)
Pt says a week ago he began having bilateral ankle pain; pt says the right ankle is worse than the left; pt denies injury; pt says the pain has become bad enough that he has to sit every few minutes at his job; pt noted to have multiple small bites to legs and arms that he says are flea bites; sometimes the bites are fluid filled; pt also c/o intermittent chest pain to the left chest that started a few days ago, more frequent episodes today; stabbing pain; productive cough of tan sputum for 'a couple of days"; intermittent "little bit" of shortness of breath; pt in no acute distress; ambulatory with steady gait; talking in complete coherent sentences

## 2018-02-13 ENCOUNTER — Emergency Department: Payer: Self-pay

## 2018-02-13 MED ORDER — IBUPROFEN 400 MG PO TABS
400.0000 mg | ORAL_TABLET | Freq: Once | ORAL | Status: AC
  Administered 2018-02-13: 400 mg via ORAL
  Filled 2018-02-13: qty 1

## 2018-02-13 MED ORDER — PERMETHRIN 5 % EX CREA: TOPICAL_CREAM | CUTANEOUS | 1 refills | Status: DC

## 2018-02-13 MED ORDER — IBUPROFEN 600 MG PO TABS: 600.0000 mg | ORAL_TABLET | Freq: Once | ORAL | Status: DC

## 2018-02-13 MED ORDER — CEPHALEXIN 500 MG PO CAPS: 500.0000 mg | ORAL_CAPSULE | Freq: Four times a day (QID) | ORAL | 0 refills | Status: AC

## 2018-02-13 MED ORDER — CEPHALEXIN 500 MG PO CAPS
500.0000 mg | ORAL_CAPSULE | Freq: Once | ORAL | Status: AC
  Administered 2018-02-13: 500 mg via ORAL
  Filled 2018-02-13: qty 1

## 2018-02-13 MED ORDER — PERMETHRIN 5 % EX CREA: TOPICAL_CREAM | CUTANEOUS | 1 refills | Status: AC

## 2018-02-13 NOTE — ED Provider Notes (Signed)
[facility] Emergency Department Provider Note  ___________________________________________   First MD Initiated Contact with Patient 02/13/18 0003     (approximate)  I have reviewed the triage vital signs and the nursing notes.   HISTORY  Chief Complaint Ankle Pain; Chest Pain; Insect Bite; and Cough   HPI Jeremy Kim is a 22 y.o. male with a history of intermittent chest pain was presented to the emergency department with 1 week of bilateral lower externally swelling as well as itchy and painful lesions to the bilateral lower extremities as well as bilateral forearms.  Patient says that approximate 1/2 months ago he washes With Dawn twice to relieve a flea problem.  However, he says that he is seen the fleas on the cat as well as himself as recently as 3 days ago.  However, he states that he is only started to notice lesions on his bilateral upper and lower extremities over the past 3 days.  He says that sometimes they will burst and drain a clear to yellowish fluid.  He says that he is now having bilateral ankle pain and is having difficulty walking secondary to pain.  Has not tried any medications for symptoms.  Patient also says that he is having intermittent chest pain which lasts for only a second or less and is to the left side of his chest and feels like it "grabs him."  He says that he has had this chest pain multiple times before and has been evaluated in the emergency department several times without acute pathology.  Denies any chest pain or shortness of breath at this time.  History reviewed. No pertinent past medical history.  There are no active problems to display for this patient.   History reviewed. No pertinent surgical history.  Prior to Admission medications   Not on File    Allergies Patient has no known allergies.  History reviewed. No pertinent family history.  Social History Social History   Tobacco Use  . Smoking status:  Current Some Day Smoker    Types: Cigarettes  . Smokeless tobacco: Never Used  . Tobacco comment: 1 or 2 per day  Substance Use Topics  . Alcohol use: No  . Drug use: Yes    Types: Marijuana    Comment: last smoked yesterday    Review of Systems  Constitutional: No fever/chills Eyes: No visual changes. ENT: No sore throat. Cardiovascular: As above Respiratory: Denies shortness of breath. Gastrointestinal: No abdominal pain.  No nausea, no vomiting.  No diarrhea.  No constipation. Genitourinary: Negative for dysuria. Musculoskeletal: Negative for back pain. Skin: As above Neurological: Negative for headaches, focal weakness or numbness.   ____________________________________________   PHYSICAL EXAM:  VITAL SIGNS: ED Triage Vitals  Enc Vitals Group     BP 02/12/18 2141 (!) 142/80     Pulse Rate 02/12/18 2141 96     Resp 02/12/18 2141 16     Temp 02/12/18 2141 98.2 F (36.8 C)     Temp Source 02/12/18 2141 Oral     SpO2 02/12/18 2141 100 %     Weight 02/12/18 2142 200 lb (90.7 kg)     Height 02/12/18 2142 6\' 2"  (1.88 m)     Head Circumference --      Peak Flow --      Pain Score 02/12/18 2142 10     Pain Loc --      Pain Edu? --      Excl. in GC? --  Constitutional: Alert and oriented. Well appearing and in no acute distress. Eyes: Conjunctivae are normal.  Head: Atraumatic. Nose: No congestion/rhinnorhea. Mouth/Throat: Mucous membranes are moist.  Neck: No stridor.   Cardiovascular: Normal rate, regular rhythm. Grossly normal heart sounds.  Good peripheral circulation with equal and bilateral dorsalis pedis pulses.  Chest pain is not reproducible to palpation. Respiratory: Normal respiratory effort.  No retractions. Lungs CTAB. Gastrointestinal: Soft and nontender. No distention. No CVA tenderness. Musculoskeletal:  Moderate bilateral lower extremity edema from his proximal feet up to the mid calves.  There are several multiple nodules in the bilateral  lower extremities as well as multiple unroofed lesions.  None that are actively draining.  No induration.  Swelling greater on the right than the left with tenderness to the swollen areas diffusely.  Patient able to range the ankles bilaterally but painfully.  No obvious effusions palpated.  Similar lesions appearing to the bilateral forearms below the level of the patient's sleeves.  However, they are less numerous on the forearms.  No lesions on the palms and soles.  No lesions intraorally. Neurologic:  Normal speech and language. No gross focal neurologic deficits are appreciated. Skin: See above Psychiatric: Mood and affect are normal. Speech and behavior are normal.  ____________________________________________   LABS (all labs ordered are listed, but only abnormal results are displayed)  Labs Reviewed  BASIC METABOLIC PANEL - Abnormal; Notable for the following components:      Result Value   Glucose, Bld 103 (*)    Creatinine, Ser 1.26 (*)    All other components within normal limits  CBC - Abnormal; Notable for the following components:   WBC 14.9 (*)    All other components within normal limits  TROPONIN I   ____________________________________________  EKG  ED ECG REPORT I, Arelia Longestavid M Schaevitz, the attending physician, personally viewed and interpreted this ECG.   Date: 02/13/2018  EKG Time: 2131  Rate: 107  Rhythm: sinus tachycardia  Axis: Normal  Intervals:none  ST&T Change: No ST segment elevation or depression.  No abnormal T wave inversion.  ____________________________________________  RADIOLOGY  Chest x-ray without acute pathology.  Right ankle x-ray with soft tissue swelling. ____________________________________________   PROCEDURES  Procedure(s) performed:   Procedures  Critical Care performed:   ____________________________________________   INITIAL IMPRESSION / ASSESSMENT AND PLAN / ED COURSE  Pertinent labs & imaging results that were  available during my care of the patient were reviewed by me and considered in my medical decision making (see chart for details).  DDX: Peripheral edema, body lice, fleabites, musculoskeletal chest pain, chest pain, shortness of breath As part of my medical decision making, I reviewed the following data within the electronic MEDICAL RECORD NUMBER Notes from prior ED visits  Patient with heart rate of 88 in the room.  Unlikely to be PE.  Chest pain is resolved.  I saw the patient personally for similar chest pain previously.  ----------------------------------------- 3:37 AM on 02/13/2018 -----------------------------------------  Patient states the pain is improved after ibuprofen.  Patient will be discharged at this time with Keflex for cellulitis which is likely secondary to his multiple fleabites.  We will also prescribe permethrin for the patient.  He knows that he must pick up willfully medication for his cat as he likely has sequela from multiple fleabites.  He is understanding the diagnosis as well as treatment and willing to comply.  Ranging well his bilateral ankles.  Unlikely to be a septic joint. ____________________________________________   FINAL  CLINICAL IMPRESSION(S) / ED DIAGNOSES  Lower extremity cellulitis.  Fleabites.  NEW MEDICATIONS STARTED DURING THIS VISIT:  New Prescriptions   No medications on file     Note:  This document was prepared using Dragon voice recognition software and may include unintentional dictation errors.     Myrna Blazer, MD 02/13/18 [phone]

## 2018-10-18 IMAGING — CR DG CHEST 2V
1 series · 2 of 2 positions shown · non-contrast
Comparison: None.

CLINICAL DATA: Intermittent left-sided chest pain

EXAM:
CHEST  2 VIEW

[Series 1: dg chest 2 view · 0.14mm/px · 2 of 2 slices shown]
[im 1/2]
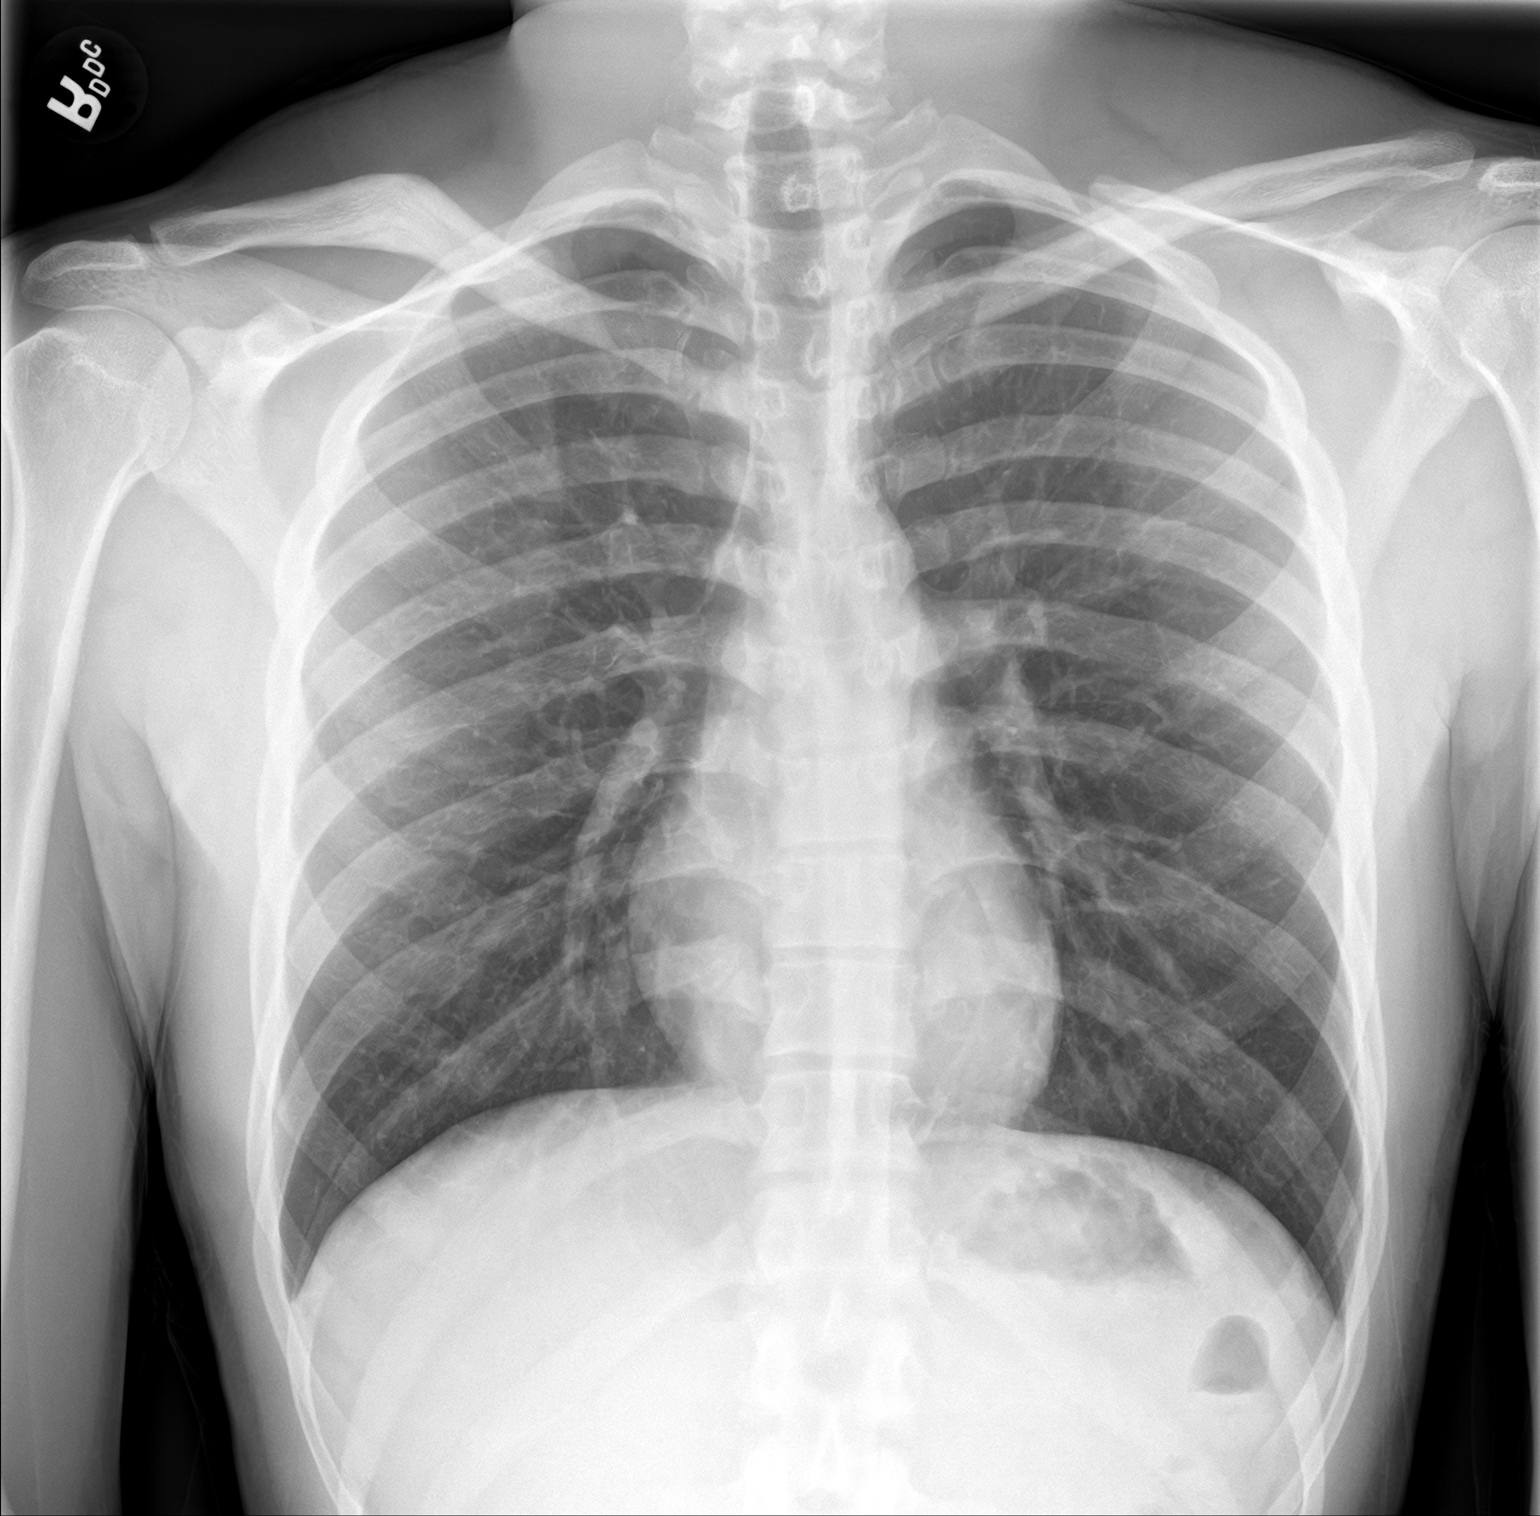
[im 2/2]
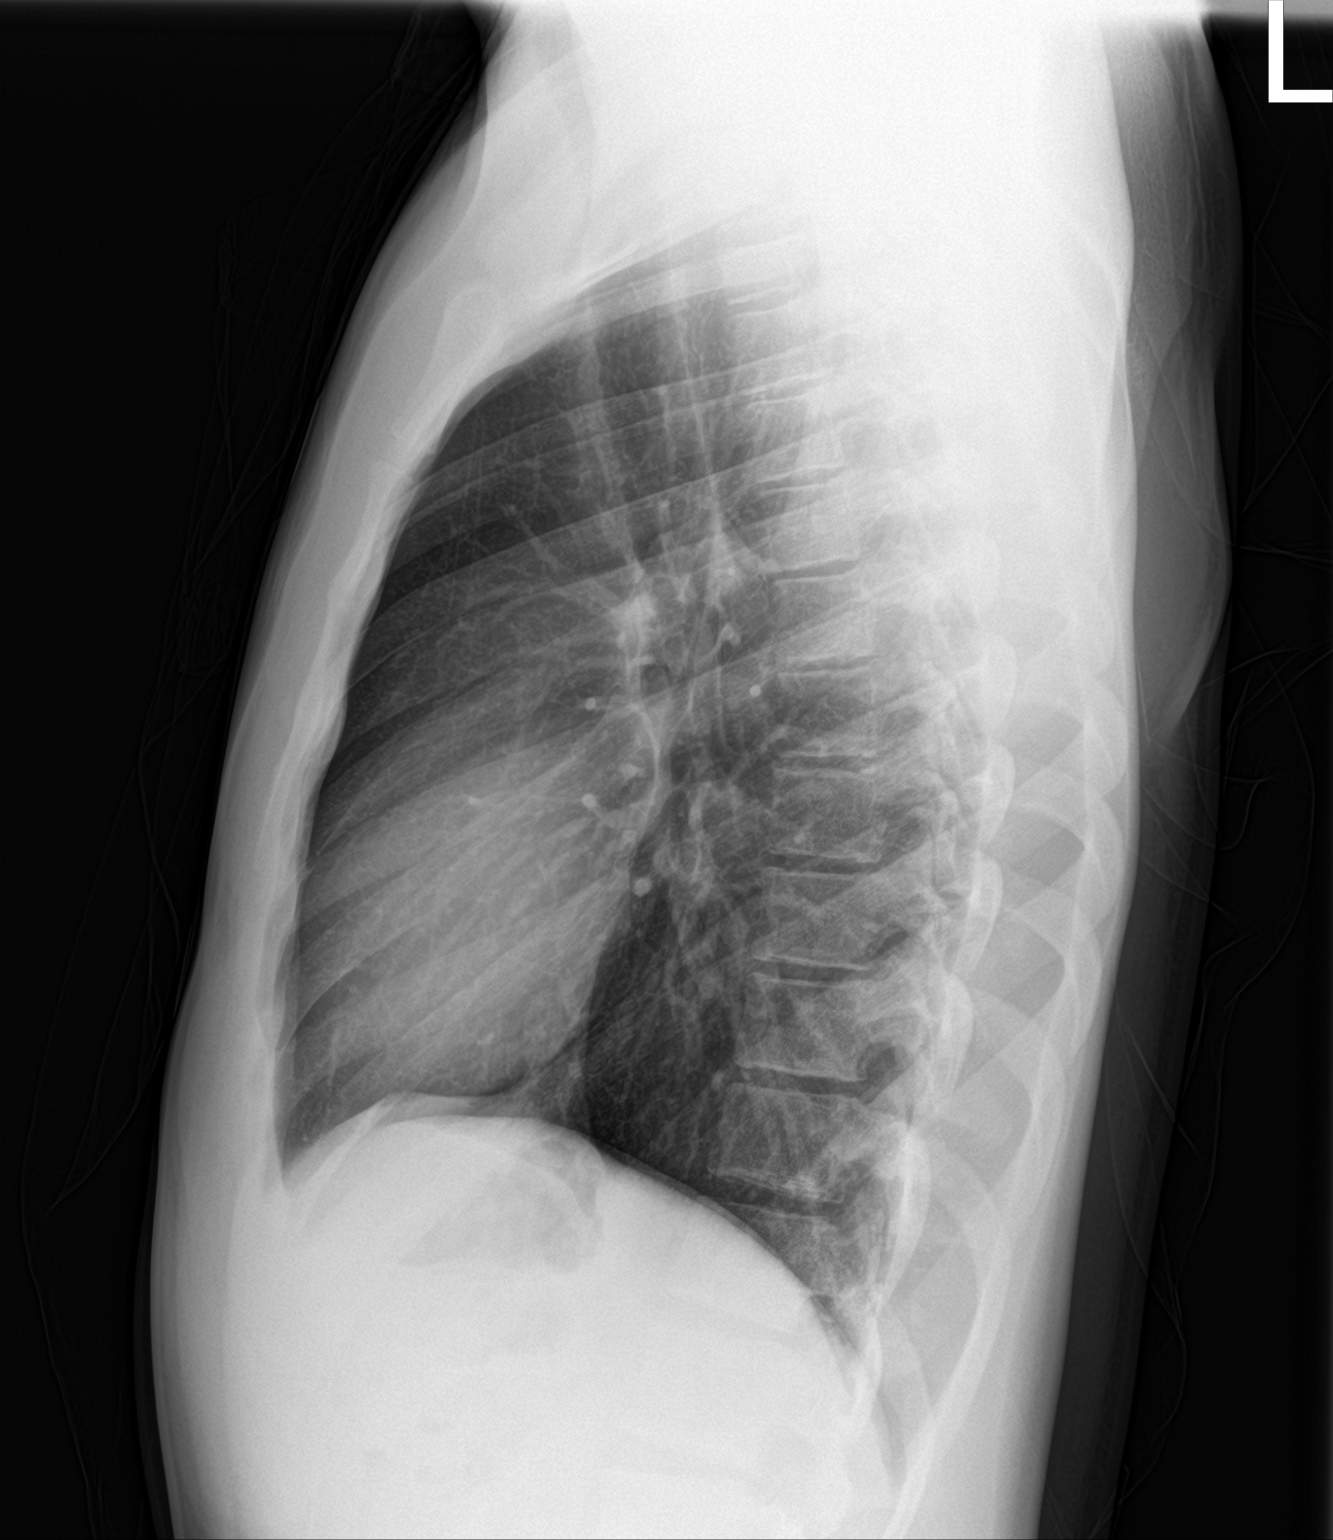

[2 of 2 positions shown; findings below may reference images not displayed]

FINDINGS: The lungs are clear without focal pneumonia, edema, pneumothorax or
pleural effusion. The cardiopericardial silhouette is within normal
limits for size. Old right clavicle fracture evident.
IMPRESSION: No active cardiopulmonary disease.

## 2019-02-01 ENCOUNTER — Other Ambulatory Visit: Payer: Self-pay

## 2019-02-01 ENCOUNTER — Ambulatory Visit: Payer: Self-pay | Admitting: Family Medicine

## 2019-02-01 ENCOUNTER — Encounter: Payer: Self-pay | Admitting: Family Medicine

## 2019-02-01 DIAGNOSIS — Z113 Encounter for screening for infections with a predominantly sexual mode of transmission: Secondary | ICD-10-CM

## 2019-02-01 DIAGNOSIS — F339 Major depressive disorder, recurrent, unspecified: Secondary | ICD-10-CM

## 2019-02-01 NOTE — Progress Notes (Signed)
Here today for STD screening. Accepts bloodwork. Zanita Millman, RN ° °

## 2019-02-01 NOTE — Progress Notes (Signed)
    STI clinic/screening visit  Subjective:  Jeremy Kim is a 23 y.o. male being seen today for an STI screening visit. The patient reports they do not have symptoms.  Patient has the following medical conditions:  There are no active problems to display for this patient.    Chief Complaint  Patient presents with  . SEXUALLY TRANSMITTED DISEASE    HPI  Patient reports he is here for STD screening, no symptoms.  See flowsheet for further details and programmatic requirements.    The following portions of the patient's history were reviewed and updated as appropriate: allergies, current medications, past medical history, past social history, past surgical history and problem list.  Objective:  There were no vitals filed for this visit.  Physical Exam Constitutional:      Appearance: Normal appearance.  HENT:     Mouth/Throat:     Mouth: Mucous membranes are moist.     Pharynx: Oropharynx is clear. No oropharyngeal exudate or posterior oropharyngeal erythema.  Neck:     Musculoskeletal: Neck supple. No muscular tenderness.  Genitourinary:    Penis: Normal.      Scrotum/Testes: Normal.  Lymphadenopathy:     Cervical: No cervical adenopathy.  Skin:    General: Skin is warm and dry.     Findings: No lesion or rash.  Neurological:     Mental Status: He is alert.    Assessment and Plan:  Jeremy Kim is a 23 y.o. male presenting to the [facility] Department for STI screening  1. Screening examination for venereal disease - HIV [place] [facility] - Syphilis Serology, [facility] - [facility], [place], Mycoplasmas N[facility], Urine Co. To use condoms always for STD prevention. 2. Depression, recurrent ([place])  - Ambulatory referral to Behavioral Health     No follow-ups on file.  No future appointments.  Hassell Done, FNP

## 2019-02-05 LAB — CT, NG, MYCOPLASMAS NAA, URINE
Chlamydia trachomatis, NAA: NEGATIVE
Mycoplasma genitalium NAA: POSITIVE — AB
Mycoplasma hominis NAA: NEGATIVE
Neisseria gonorrhoeae, NAA: NEGATIVE
Ureaplasma spp NAA: NEGATIVE

## 2019-02-16 ENCOUNTER — Ambulatory Visit: Payer: Self-pay | Admitting: Licensed Clinical Social Worker

## 2019-02-16 ENCOUNTER — Other Ambulatory Visit: Payer: Self-pay

## 2019-02-16 ENCOUNTER — Encounter: Payer: Self-pay | Admitting: Licensed Clinical Social Worker

## 2019-02-16 DIAGNOSIS — F411 Generalized anxiety disorder: Secondary | ICD-10-CM

## 2019-02-16 DIAGNOSIS — F330 Major depressive disorder, recurrent, mild: Secondary | ICD-10-CM
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
## 2019-02-28 ENCOUNTER — Ambulatory Visit: Payer: Self-pay | Admitting: Licensed Clinical Social Worker

## 2019-03-06 ENCOUNTER — Ambulatory Visit: Payer: Self-pay | Admitting: Licensed Clinical Social Worker

## 2019-03-06 DIAGNOSIS — F411 Generalized anxiety disorder: Secondary | ICD-10-CM

## 2019-03-06 DIAGNOSIS — F330 Major depressive disorder, recurrent, mild: Secondary | ICD-10-CM

## 2019-03-06 NOTE — Progress Notes (Signed)
Counselor/Therapist Progress Note  Patient ID: Jeremy Kim, MRN: [id number],    Date: 03/06/2019  Time Spent: 45 minutes  Treatment Type: Psychotherapy  Reported Symptoms: Isolation and withdrawal and irritability, anxious thoughts, negative self evaluation, low mood  Mental Status Exam:  Appearance:   Casual     Behavior:  Appropriate and Sharing  Motor:  Normal  Speech/Language:   Normal Rate  Affect:  Appropriate  Mood:  normal  Thought process:  normal  Thought content:    WNL  Sensory/Perceptual disturbances:    WNL  Orientation:  oriented to person, place and time/date  Attention:  Good  Concentration:  Good  Memory:  WNL  Fund of knowledge:   Good  Insight:    Good  Judgment:   Good  Impulse Control:  Good   Risk Assessment: Danger to Self:  No Self-injurious Behavior: No Danger to Others: No Duty to Warn:no Physical Aggression / Violence:No  Access to Firearms a concern: No  Gang Involvement:No   Subjective: Patient was engaged and cooperative throughout the session using time effectively to discuss thoughts, feelings, and treatment plan. Patient voices continued motivation for treatment and understanding of depression and anxiety and CBTs. Patient is likely to benefit from future treatment because he remains motivated to decrease symptoms and reports likeing sessions to this point.     Interventions: Cognitive Behavioral Therapy  Established psychological safety.  Checked in with patient and reviewed previous session, including assessment and goal of treatment. Reviewed CBTs. Explored patient's goal of treatment and worked collaboratively to develop CBT treatment plan. Provided Psychoeducation on Unhelpful Thinking Styles. Encouraged patient to notice physical sensations, thoughts, and behaviors as practice task.  Provided support through active listening, validation of feelings, and highlighted patient's strengths.   Diagnosis:   ICD-10-CM   1. Major  depressive disorder, recurrent episode, mild (HCC)  F33.0   2. Generalized anxiety disorder  F41.1     Plan:  Check in with patient on practice task:  Noticing physical sensations, thoughts, and behaviors. LCSW and patient agred to continue Zoom sessions every two weeks.  Patient's goal of treatment is to to get into a better mind state- to get out of a dark hole.  Treatment Target: Understand the relationship between thoughts, emotions, and behaviors  - Psychoeducation on CBT model   - Teach the connection between thoughts, emotions, and behaviors  - Understand thoughts, emotions, and behaviors  Treatment Target: Increase realistic balanced thinking  - Explore patient's thoughts, beliefs, automatic thoughts, assumptions  - Identify and replace thinking that leads to depression - Process distress and allow for emotional release  - Cognitive reframing  - Questioning and challenging thoughts - Modify underlying beliefs  Treatment Target:  - Learn and implement coping skills / relaxation techniques  - Mindfulness practices  - Values clarification   - Self-care - nutrition, sleep, exercise   Interpreter used: NA   Milton Ferguson, LCSW

## 2019-03-20 ENCOUNTER — Ambulatory Visit: Payer: Self-pay | Admitting: Licensed Clinical Social Worker

## 2019-12-02 ENCOUNTER — Emergency Department (HOSPITAL_COMMUNITY): Payer: Self-pay

## 2019-12-02 ENCOUNTER — Emergency Department (HOSPITAL_COMMUNITY)
Admission: EM | Admit: 2019-12-02 | Discharge: 2019-12-02 | Disposition: A | Discharge status: Home / Self Care | Payer: Self-pay | Attending: Emergency Medicine | Admitting: Emergency Medicine

## 2019-12-02 ENCOUNTER — Encounter (HOSPITAL_COMMUNITY): Payer: Self-pay

## 2019-12-02 DIAGNOSIS — Z20822 Contact with and (suspected) exposure to covid-19: Secondary | ICD-10-CM | POA: Insufficient documentation

## 2019-12-02 DIAGNOSIS — Y999 Unspecified external cause status: Secondary | ICD-10-CM | POA: Insufficient documentation

## 2019-12-02 DIAGNOSIS — Y92511 Restaurant or cafe as the place of occurrence of the external cause: Secondary | ICD-10-CM | POA: Insufficient documentation

## 2019-12-02 DIAGNOSIS — Y939 Activity, unspecified: Secondary | ICD-10-CM | POA: Insufficient documentation

## 2019-12-02 DIAGNOSIS — W3400XA Accidental discharge from unspecified firearms or gun, initial encounter: Secondary | ICD-10-CM | POA: Insufficient documentation

## 2019-12-02 DIAGNOSIS — S81002A Unspecified open wound, left knee, initial encounter: Secondary | ICD-10-CM | POA: Insufficient documentation

## 2019-12-02 LAB — COMPREHENSIVE METABOLIC PANEL
ALT: 91 U/L — ABNORMAL HIGH (ref 0–44)
AST: 287 U/L — ABNORMAL HIGH (ref 15–41)
Albumin: 4.3 g/dL (ref 3.5–5.0)
Alkaline Phosphatase: 48 U/L (ref 38–126)
Anion gap: 15 (ref 5–15)
BUN: 9 mg/dL (ref 6–20)
CO2: 17 mmol/L — ABNORMAL LOW (ref 22–32)
Calcium: 9.3 mg/dL (ref 8.9–10.3)
Chloride: 107 mmol/L (ref 98–111)
Creatinine, Ser: 1.18 mg/dL (ref 0.61–1.24)
GFR calc Af Amer: >60 mL/min (ref >60)
GFR calc non Af Amer: >60 mL/min (ref >60)
Glucose, Bld: 128 mg/dL — ABNORMAL HIGH (ref 70–99)
Potassium: 4.5 mmol/L (ref 3.5–5.1)
Sodium: 139 mmol/L (ref 135–145)
Total Bilirubin: 0.9 mg/dL (ref 0.3–1.2)
Total Protein: 6.7 g/dL (ref 6.5–8.1)

## 2019-12-02 LAB — I-STAT CHEM 8, ED
BUN: 8 mg/dL (ref 6–20)
Calcium, Ion: 1 mmol/L — ABNORMAL LOW (ref 1.15–1.40)
Chloride: 105 mmol/L (ref 98–111)
Creatinine, Ser: 1.2 mg/dL (ref 0.61–1.24)
Glucose, Bld: 127 mg/dL — ABNORMAL HIGH (ref 70–99)
HCT: 46 % (ref 39.0–52.0)
Hemoglobin: 15.6 g/dL (ref 13.0–17.0)
Potassium: 3.6 mmol/L (ref 3.5–5.1)
Sodium: 141 mmol/L (ref 135–145)
TCO2: 19 mmol/L — ABNORMAL LOW (ref 22–32)

## 2019-12-02 LAB — CBC
HCT: 44.2 % (ref 39.0–52.0)
Hemoglobin: 14.3 g/dL (ref 13.0–17.0)
MCH: 29.2 pg (ref 26.0–34.0)
MCHC: 32.4 g/dL (ref 30.0–36.0)
MCV: 90.2 fL (ref 80.0–100.0)
Platelets: 305 10*3/uL (ref 150–400)
RBC: 4.9 MIL/uL (ref 4.22–5.81)
RDW: 13.1 % (ref 11.5–15.5)
WBC: 9.8 10*3/uL (ref 4.0–10.5)
nRBC: 0 % (ref 0.0–0.2)

## 2019-12-02 LAB — URINALYSIS, ROUTINE W REFLEX MICROSCOPIC
Bacteria, UA: NONE SEEN
Bilirubin Urine: NEGATIVE
Glucose, UA: NEGATIVE mg/dL
Ketones, ur: NEGATIVE mg/dL
Leukocytes,Ua: NEGATIVE
Nitrite: NEGATIVE
Protein, ur: NEGATIVE mg/dL
Specific Gravity, Urine: 1.021 (ref 1.005–1.030)
pH: 6 (ref 5.0–8.0)

## 2019-12-02 LAB — SAMPLE TO BLOOD BANK

## 2019-12-02 LAB — SARS CORONAVIRUS 2 BY RT PCR (HOSPITAL ORDER, PERFORMED IN ~~LOC~~ HOSPITAL LAB): SARS Coronavirus 2: NEGATIVE

## 2019-12-02 LAB — PROTIME-INR
INR: 1 (ref 0.8–1.2)
Prothrombin Time: 12.5 seconds (ref 11.4–15.2)

## 2019-12-02 LAB — ETHANOL: Alcohol, Ethyl (B): 125 mg/dL — ABNORMAL HIGH (ref <10)

## 2019-12-02 MED ORDER — LORAZEPAM 2 MG/ML IJ SOLN
0.5000 mg | Freq: Once | INTRAMUSCULAR | Status: AC
  Administered 2019-12-02: 0.5 mg via INTRAVENOUS
  Filled 2019-12-02: qty 1

## 2019-12-02 MED ORDER — IOHEXOL 350 MG/ML SOLN
100.0000 mL | Freq: Once | INTRAVENOUS | Status: AC | PRN
  Administered 2019-12-02: 100 mL via INTRAVENOUS

## 2019-12-02 MED ORDER — TETANUS-DIPHTH-ACELL PERTUSSIS 5-2.5-18.5 LF-MCG/0.5 IM SUSP
0.5000 mL | Freq: Once | INTRAMUSCULAR | Status: AC
  Administered 2019-12-02: 0.5 mL via INTRAMUSCULAR
  Filled 2019-12-02: qty 0.5

## 2019-12-02 MED ORDER — LACTATED RINGERS IV BOLUS
1000.0000 mL | Freq: Once | INTRAVENOUS | Status: AC
  Administered 2019-12-02: 1000 mL via INTRAVENOUS
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
## 2019-12-02 NOTE — ED Notes (Signed)
Pt ambulated in room with crutches.
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
## 2019-12-04 ENCOUNTER — Encounter: Payer: Self-pay | Admitting: Licensed Clinical Social Worker

## 2019-12-19 ENCOUNTER — Ambulatory Visit (INDEPENDENT_AMBULATORY_CARE_PROVIDER_SITE_OTHER): Payer: Self-pay | Admitting: Orthopaedic Surgery

## 2019-12-19 ENCOUNTER — Encounter: Payer: Self-pay | Admitting: Orthopaedic Surgery

## 2019-12-19 ENCOUNTER — Ambulatory Visit (INDEPENDENT_AMBULATORY_CARE_PROVIDER_SITE_OTHER): Payer: Self-pay

## 2019-12-19 DIAGNOSIS — S80252A Superficial foreign body, left knee, initial encounter: Secondary | ICD-10-CM | POA: Insufficient documentation

## 2019-12-19 DIAGNOSIS — M25562 Pain in left knee: Secondary | ICD-10-CM

## 2019-12-19 MED ORDER — HYDROCODONE-ACETAMINOPHEN 5-325 MG PO TABS: 1.0000 | ORAL_TABLET | Freq: Four times a day (QID) | ORAL | 0 refills | Status: DC | PRN
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
## 2019-12-22 ENCOUNTER — Other Ambulatory Visit: Payer: Self-pay | Admitting: Physician Assistant

## 2019-12-23 ENCOUNTER — Other Ambulatory Visit (HOSPITAL_COMMUNITY)
Admission: RE | Admit: 2019-12-23 | Discharge: 2019-12-23 | Disposition: A | Discharge status: Home / Self Care | Payer: HRSA Program | Source: Ambulatory Visit | Attending: Orthopaedic Surgery | Admitting: Orthopaedic Surgery

## 2019-12-23 DIAGNOSIS — Z20822 Contact with and (suspected) exposure to covid-19: Secondary | ICD-10-CM | POA: Diagnosis not present

## 2019-12-23 DIAGNOSIS — Z01818 Encounter for other preprocedural examination: Secondary | ICD-10-CM | POA: Diagnosis present

## 2019-12-23 LAB — SARS CORONAVIRUS 2 (TAT 6-24 HRS): SARS Coronavirus 2: NEGATIVE

## 2019-12-25 ENCOUNTER — Other Ambulatory Visit: Payer: Self-pay

## 2019-12-25 ENCOUNTER — Encounter (HOSPITAL_COMMUNITY): Payer: Self-pay | Admitting: Orthopaedic Surgery

## 2019-12-26 ENCOUNTER — Encounter (HOSPITAL_COMMUNITY)
Admission: RE | Disposition: A | Discharge status: Home / Self Care | Payer: Self-pay | Source: Home / Self Care | Attending: Orthopaedic Surgery

## 2019-12-26 ENCOUNTER — Other Ambulatory Visit: Payer: Self-pay

## 2019-12-26 ENCOUNTER — Ambulatory Visit (HOSPITAL_COMMUNITY)
Admission: RE | Admit: 2019-12-26 | Discharge: 2019-12-26 | Disposition: A | Discharge status: Home / Self Care | Payer: Self-pay | Attending: Orthopaedic Surgery | Admitting: Orthopaedic Surgery

## 2019-12-26 ENCOUNTER — Ambulatory Visit (HOSPITAL_COMMUNITY): Payer: Self-pay | Admitting: Anesthesiology

## 2019-12-26 ENCOUNTER — Encounter (HOSPITAL_COMMUNITY): Payer: Self-pay | Admitting: Orthopaedic Surgery

## 2019-12-26 DIAGNOSIS — Z87891 Personal history of nicotine dependence: Secondary | ICD-10-CM | POA: Insufficient documentation

## 2019-12-26 DIAGNOSIS — M795 Residual foreign body in soft tissue: Secondary | ICD-10-CM | POA: Insufficient documentation

## 2019-12-26 DIAGNOSIS — S80252A Superficial foreign body, left knee, initial encounter: Secondary | ICD-10-CM | POA: Diagnosis present

## 2019-12-26 HISTORY — PX: FOREIGN BODY REMOVAL: SHX962

## 2019-12-26 HISTORY — DX: Anxiety disorder, unspecified: F41.9

## 2019-12-26 LAB — POCT I-STAT, CHEM 8
BUN: 13 mg/dL (ref 6–20)
Calcium, Ion: 1 mmol/L — ABNORMAL LOW (ref 1.15–1.40)
Chloride: 105 mmol/L (ref 98–111)
Creatinine, Ser: 1 mg/dL (ref 0.61–1.24)
Glucose, Bld: 97 mg/dL (ref 70–99)
HCT: 51 % (ref 39.0–52.0)
Hemoglobin: 17.3 g/dL — ABNORMAL HIGH (ref 13.0–17.0)
Potassium: 4.6 mmol/L (ref 3.5–5.1)
Sodium: 139 mmol/L (ref 135–145)
TCO2: 20 mmol/L — ABNORMAL LOW (ref 22–32)

## 2019-12-26 SURGERY — FOREIGN BODY REMOVAL ADULT
Anesthesia: General | Laterality: Left

## 2019-12-26 MED ORDER — FENTANYL CITRATE (PF) 250 MCG/5ML IJ SOLN
INTRAMUSCULAR | Status: DC | PRN
  Administered 2019-12-26: 50 ug via INTRAVENOUS

## 2019-12-26 MED ORDER — DEXAMETHASONE SODIUM PHOSPHATE 10 MG/ML IJ SOLN
INTRAMUSCULAR | Status: DC | PRN
  Administered 2019-12-26: 10 mg via INTRAVENOUS

## 2019-12-26 MED ORDER — PROPOFOL 10 MG/ML IV BOLUS
INTRAVENOUS | Status: AC
  Filled 2019-12-26: qty 20

## 2019-12-26 MED ORDER — MIDAZOLAM HCL 2 MG/2ML IJ SOLN
INTRAMUSCULAR | Status: AC
  Filled 2019-12-26: qty 2

## 2019-12-26 MED ORDER — BUPIVACAINE HCL 0.25 % IJ SOLN
INTRAMUSCULAR | Status: DC | PRN
  Administered 2019-12-26: 10 mL

## 2019-12-26 MED ORDER — ONDANSETRON HCL 4 MG/2ML IJ SOLN
INTRAMUSCULAR | Status: DC | PRN
  Administered 2019-12-26: 4 mg via INTRAVENOUS

## 2019-12-26 MED ORDER — FENTANYL CITRATE (PF) 100 MCG/2ML IJ SOLN
25.0000 ug | INTRAMUSCULAR | Status: DC | PRN
  Administered 2019-12-26 (×2): 50 ug via INTRAVENOUS

## 2019-12-26 MED ORDER — LACTATED RINGERS IV SOLN: INTRAVENOUS | Status: DC | PRN

## 2019-12-26 MED ORDER — CEFAZOLIN SODIUM-DEXTROSE 2-4 GM/100ML-% IV SOLN
2.0000 g | INTRAVENOUS | Status: AC
  Administered 2019-12-26: 2 g via INTRAVENOUS
  Filled 2019-12-26: qty 100

## 2019-12-26 MED ORDER — FENTANYL CITRATE (PF) 100 MCG/2ML IJ SOLN
INTRAMUSCULAR | Status: AC
  Filled 2019-12-26: qty 2

## 2019-12-26 MED ORDER — HYDROCODONE-ACETAMINOPHEN 5-325 MG PO TABS: 1.0000 | ORAL_TABLET | Freq: Four times a day (QID) | ORAL | 0 refills | Status: AC | PRN

## 2019-12-26 MED ORDER — ACETAMINOPHEN 500 MG PO TABS
1000.0000 mg | ORAL_TABLET | Freq: Once | ORAL | Status: AC
  Administered 2019-12-26: 1000 mg via ORAL
  Filled 2019-12-26: qty 2

## 2019-12-26 MED ORDER — PROPOFOL 10 MG/ML IV BOLUS
INTRAVENOUS | Status: DC | PRN
  Administered 2019-12-26: 200 mg via INTRAVENOUS

## 2019-12-26 MED ORDER — CELECOXIB 200 MG PO CAPS
200.0000 mg | ORAL_CAPSULE | Freq: Once | ORAL | Status: AC
  Administered 2019-12-26: 200 mg via ORAL
  Filled 2019-12-26: qty 1

## 2019-12-26 MED ORDER — LIDOCAINE 2% (20 MG/ML) 5 ML SYRINGE
INTRAMUSCULAR | Status: DC | PRN
  Administered 2019-12-26: 60 mg via INTRAVENOUS

## 2019-12-26 MED ORDER — BUPIVACAINE HCL (PF) 0.25 % IJ SOLN
INTRAMUSCULAR | Status: AC
  Filled 2019-12-26: qty 10

## 2019-12-26 MED ORDER — FENTANYL CITRATE (PF) 250 MCG/5ML IJ SOLN
INTRAMUSCULAR | Status: AC
  Filled 2019-12-26: qty 5

## 2019-12-26 MED ORDER — DEXMEDETOMIDINE (PRECEDEX) IN NS 20 MCG/5ML (4 MCG/ML) IV SYRINGE
PREFILLED_SYRINGE | INTRAVENOUS | Status: DC | PRN
  Administered 2019-12-26 (×2): 4 ug via INTRAVENOUS

## 2019-12-26 MED ORDER — MIDAZOLAM HCL 2 MG/2ML IJ SOLN
INTRAMUSCULAR | Status: DC | PRN
  Administered 2019-12-26: 2 mg via INTRAVENOUS

## 2019-12-26 MED ORDER — AMISULPRIDE (ANTIEMETIC) 5 MG/2ML IV SOLN: 10.0000 mg | Freq: Once | INTRAVENOUS | Status: DC | PRN

## 2019-12-26 SURGICAL SUPPLY — 50 items
BLADE SURG 10 STRL SS (BLADE) ×3 IMPLANT
BNDG COHESIVE 4X5 TAN STRL (GAUZE/BANDAGES/DRESSINGS) ×3 IMPLANT
BNDG COHESIVE 6X5 TAN STRL LF (GAUZE/BANDAGES/DRESSINGS) IMPLANT
BNDG ELASTIC 3X5.8 VLCR STR LF (GAUZE/BANDAGES/DRESSINGS) IMPLANT
BNDG ELASTIC 4X5.8 VLCR STR LF (GAUZE/BANDAGES/DRESSINGS) ×3 IMPLANT
COVER SURGICAL LIGHT HANDLE (MISCELLANEOUS) ×3 IMPLANT
COVER WAND RF STERILE (DRAPES) ×3 IMPLANT
CUFF TOURN SGL QUICK 18X4 (TOURNIQUET CUFF) IMPLANT
CUFF TOURN SGL QUICK 34 (TOURNIQUET CUFF) ×2
CUFF TRNQT CYL 34X4.125X (TOURNIQUET CUFF) ×1 IMPLANT
DRAPE ORTHO SPLIT 77X108 STRL (DRAPES) ×4
DRAPE SURG 17X23 STRL (DRAPES) IMPLANT
DRAPE SURG ORHT 6 SPLT 77X108 (DRAPES) ×2 IMPLANT
DRAPE U-SHAPE 47X51 STRL (DRAPES) ×3 IMPLANT
DRSG XEROFORM 1X8 (GAUZE/BANDAGES/DRESSINGS) ×3 IMPLANT
DURAPREP 26ML APPLICATOR (WOUND CARE) ×3 IMPLANT
ELECT REM PT RETURN 9FT ADLT (ELECTROSURGICAL)
ELECTRODE REM PT RTRN 9FT ADLT (ELECTROSURGICAL) IMPLANT
GAUZE SPONGE 4X4 12PLY STRL (GAUZE/BANDAGES/DRESSINGS) ×3 IMPLANT
GAUZE XEROFORM 1X8 LF (GAUZE/BANDAGES/DRESSINGS) ×3 IMPLANT
GLOVE BIO SURGEON STRL SZ8 (GLOVE) ×3 IMPLANT
GLOVE BIOGEL PI IND STRL 8 (GLOVE) ×2 IMPLANT
GLOVE BIOGEL PI INDICATOR 8 (GLOVE) ×4
GLOVE ORTHO TXT STRL SZ7.5 (GLOVE) ×3 IMPLANT
GOWN STRL REUS W/ TWL LRG LVL3 (GOWN DISPOSABLE) ×1 IMPLANT
GOWN STRL REUS W/ TWL XL LVL3 (GOWN DISPOSABLE) ×2 IMPLANT
GOWN STRL REUS W/TWL LRG LVL3 (GOWN DISPOSABLE) ×2
GOWN STRL REUS W/TWL XL LVL3 (GOWN DISPOSABLE) ×4
HANDPIECE INTERPULSE COAX TIP (DISPOSABLE)
KIT BASIN OR (CUSTOM PROCEDURE TRAY) ×3 IMPLANT
KIT TURNOVER KIT B (KITS) ×3 IMPLANT
MANIFOLD NEPTUNE II (INSTRUMENTS) ×3 IMPLANT
NS IRRIG 1000ML POUR BTL (IV SOLUTION) ×3 IMPLANT
PACK ORTHO EXTREMITY (CUSTOM PROCEDURE TRAY) ×3 IMPLANT
PAD ARMBOARD 7.5X6 YLW CONV (MISCELLANEOUS) ×6 IMPLANT
PADDING CAST ABS 4INX4YD NS (CAST SUPPLIES) ×4
PADDING CAST ABS COTTON 4X4 ST (CAST SUPPLIES) ×2 IMPLANT
PADDING CAST COTTON 6X4 STRL (CAST SUPPLIES) ×3 IMPLANT
SET HNDPC FAN SPRY TIP SCT (DISPOSABLE) IMPLANT
SPONGE LAP 18X18 RF (DISPOSABLE) ×3 IMPLANT
STOCKINETTE IMPERVIOUS 9X36 MD (GAUZE/BANDAGES/DRESSINGS) ×3 IMPLANT
SUT ETHILON 2 0 FS 18 (SUTURE) IMPLANT
SUT ETHILON 3 0 PS 1 (SUTURE) IMPLANT
SWAB CULTURE ESWAB REG 1ML (MISCELLANEOUS) IMPLANT
TOWEL GREEN STERILE (TOWEL DISPOSABLE) ×3 IMPLANT
TOWEL GREEN STERILE FF (TOWEL DISPOSABLE) ×3 IMPLANT
TUBE CONNECTING 12'X1/4 (SUCTIONS) ×1
TUBE CONNECTING 12X1/4 (SUCTIONS) ×2 IMPLANT
UNDERPAD 30X36 HEAVY ABSORB (UNDERPADS AND DIAPERS) ×3 IMPLANT
YANKAUER SUCT BULB TIP NO VENT (SUCTIONS) ×3 IMPLANT
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
## 2019-12-26 NOTE — Anesthesia Postprocedure Evaluation (Signed)
Anesthesia Post Note  Patient: Jeremy Kim  Procedure(s) Performed: FOREIGN BODY REMOVAL LEFT KNEE (Left )     Patient location during evaluation: PACU Anesthesia Type: General Level of consciousness: awake and alert Pain management: pain level controlled Vital Signs Assessment: post-procedure vital signs reviewed and stable Respiratory status: spontaneous breathing, nonlabored ventilation, respiratory function stable and patient connected to nasal cannula oxygen Cardiovascular status: blood pressure returned to baseline and stable Postop Assessment: no apparent nausea or vomiting Anesthetic complications: no   No complications documented.  Last Vitals:  Vitals:   12/26/19 1645 12/26/19 1651  BP:  128/70  Pulse: 64 62  Resp: (!) 23 20  Temp:  (!) 36.4 C  SpO2: 99% 99%    Last Pain:  Vitals:   12/26/19 1645  PainSc: Asleep                 Kennieth Rad
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
## 2019-12-26 NOTE — Transfer of Care (Signed)
Immediate Anesthesia Transfer of Care Note  Patient: Jeremy Kim  Procedure(s) Performed: FOREIGN BODY REMOVAL LEFT KNEE (Left )  Patient Location: PACU  Anesthesia Type:General  Level of Consciousness: drowsy  Airway & Oxygen Therapy: Patient Spontanous Breathing and Patient connected to nasal cannula oxygen  Post-op Assessment: Report given to RN and Post -op Vital signs reviewed and stable  Post vital signs: Reviewed and stable  Last Vitals:  Vitals Value Taken Time  BP 126/82 12/26/19 1606  Temp    Pulse 80 12/26/19 1606  Resp 16 12/26/19 1606  SpO2 100 % 12/26/19 1606  Vitals shown include unvalidated device data.  Last Pain:  Vitals:   12/26/19 1319  PainSc: 0-No pain      Patients Stated Pain Goal: 3 (12/26/19 1319)  Complications: No complications documented.

## 2019-12-26 NOTE — Anesthesia Preprocedure Evaluation (Signed)
Anesthesia Evaluation  Patient identified by MRN, date of birth, ID band Patient awake    Reviewed: Allergy & Precautions, NPO status , Patient's Chart, lab work & pertinent test results  Airway Mallampati: II  TM Distance: >3 FB Neck ROM: Full    Dental  (+) Dental Advisory Given   Pulmonary former smoker,    breath sounds clear to auscultation       Cardiovascular negative cardio ROS   Rhythm:Regular Rate:Normal     Neuro/Psych negative neurological ROS     GI/Hepatic negative GI ROS, Neg liver ROS,   Endo/Other  negative endocrine ROS  Renal/GU negative Renal ROS     Musculoskeletal   Abdominal   Peds  Hematology negative hematology ROS (+)   Anesthesia Other Findings   Reproductive/Obstetrics                             Anesthesia Physical Anesthesia Plan  ASA: I  Anesthesia Plan: General   Post-op Pain Management:    Induction: Intravenous  PONV Risk Score and Plan: 2 and Dexamethasone, Ondansetron and Treatment may vary due to age or medical condition  Airway Management Planned: LMA  Additional Equipment: None  Intra-op Plan:   Post-operative Plan: Extubation in OR  Informed Consent: I have reviewed the patients History and Physical, chart, labs and discussed the procedure including the risks, benefits and alternatives for the proposed anesthesia with the patient or authorized representative who has indicated his/her understanding and acceptance.     Dental advisory given  Plan Discussed with: CRNA  Anesthesia Plan Comments:         Anesthesia Quick Evaluation

## 2019-12-26 NOTE — Anesthesia Procedure Notes (Signed)
Procedure Name: LMA Insertion Date/Time: 12/26/2019 3:27 PM Performed by: Dairl Ponder, CRNA Pre-anesthesia Checklist: Patient identified, Emergency Drugs available, Suction available, Patient being monitored and Timeout performed Patient Re-evaluated:Patient Re-evaluated prior to induction Oxygen Delivery Method: Circle system utilized Preoxygenation: Pre-oxygenation with 100% oxygen Induction Type: IV induction LMA: LMA inserted LMA Size: 5.0 Number of attempts: 1 Placement Confirmation: positive ETCO2 and breath sounds checked- equal and bilateral Tube secured with: Tape Dental Injury: Teeth and Oropharynx as per pre-operative assessment

## 2019-12-26 NOTE — Discharge Instructions (Signed)
You may increase your activities as comfort allows. You may put all of your weight on your left leg as comfort allows. Expect some swelling of the surgical site - ice and elevation intermittently as needed. Keep your current dressing clean and dry. You may remove your dressing in 6 to 7 days and get your actual incision wet in the shower daily at that point. After you start showering, place a large Band-Aid over the stitches daily to protect them.

## 2019-12-27 ENCOUNTER — Telehealth: Payer: Self-pay | Admitting: Orthopaedic Surgery

## 2019-12-27 ENCOUNTER — Encounter (HOSPITAL_COMMUNITY): Payer: Self-pay | Admitting: Orthopaedic Surgery

## 2019-12-27 MED ORDER — TRAMADOL HCL 50 MG PO TABS: 50.0000 mg | ORAL_TABLET | Freq: Four times a day (QID) | ORAL | 0 refills | Status: DC | PRN

## 2019-12-27 NOTE — Telephone Encounter (Signed)
Please advise 

## 2019-12-27 NOTE — Telephone Encounter (Signed)
Patient's girlfriend Magda Paganini called and stated patient had an itchy allergic reaction to hydrocodone and is requesting a change of medication. Please send new script to [facility] in [place] on [address]. Magda Paganini and patient  phone number is [phone]

## 2019-12-28 ENCOUNTER — Other Ambulatory Visit: Payer: Self-pay | Admitting: Orthopaedic Surgery

## 2019-12-28 MED ORDER — TRAMADOL HCL 50 MG PO TABS: 50.0000 mg | ORAL_TABLET | Freq: Four times a day (QID) | ORAL | 0 refills | Status: AC | PRN

## 2020-01-11 ENCOUNTER — Ambulatory Visit: Payer: Self-pay | Admitting: Orthopaedic Surgery

## 2020-01-18 ENCOUNTER — Encounter: Payer: Self-pay | Admitting: Physician Assistant

## 2020-01-18 ENCOUNTER — Ambulatory Visit (INDEPENDENT_AMBULATORY_CARE_PROVIDER_SITE_OTHER): Payer: Self-pay | Admitting: Physician Assistant

## 2020-01-18 DIAGNOSIS — S80252A Superficial foreign body, left knee, initial encounter: Secondary | ICD-10-CM
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
## 2020-02-15 ENCOUNTER — Ambulatory Visit: Payer: Self-pay | Admitting: Physician Assistant

## 2020-02-19 ENCOUNTER — Ambulatory Visit (INDEPENDENT_AMBULATORY_CARE_PROVIDER_SITE_OTHER): Payer: Self-pay | Admitting: Physician Assistant

## 2020-02-19 ENCOUNTER — Other Ambulatory Visit: Payer: Self-pay | Admitting: Physician Assistant

## 2020-02-19 ENCOUNTER — Encounter: Payer: Self-pay | Admitting: Physician Assistant

## 2020-02-19 DIAGNOSIS — M25562 Pain in left knee: Secondary | ICD-10-CM
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
## 2020-02-20 ENCOUNTER — Other Ambulatory Visit: Payer: Self-pay | Admitting: Radiology

## 2020-03-11 ENCOUNTER — Ambulatory Visit
Admission: RE | Admit: 2020-03-11 | Discharge: 2020-03-11 | Disposition: A | Discharge status: Home / Self Care | Payer: Self-pay | Source: Ambulatory Visit | Attending: Physician Assistant | Admitting: Physician Assistant

## 2020-03-11 DIAGNOSIS — M25562 Pain in left knee: Secondary | ICD-10-CM

## 2020-03-18 ENCOUNTER — Encounter: Payer: Self-pay | Admitting: Physician Assistant

## 2020-03-18 ENCOUNTER — Ambulatory Visit (INDEPENDENT_AMBULATORY_CARE_PROVIDER_SITE_OTHER): Payer: Self-pay | Admitting: Physician Assistant

## 2020-03-18 DIAGNOSIS — M25562 Pain in left knee: Secondary | ICD-10-CM
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
## 2020-03-18 NOTE — Addendum Note (Signed)
Addended by: Fredda Hammed L on: 03/18/2020 10:30 AM   Modules accepted: Orders

## 2020-04-05 ENCOUNTER — Ambulatory Visit: Payer: Self-pay | Attending: Physician Assistant | Admitting: Physical Therapy

## 2020-04-29 ENCOUNTER — Ambulatory Visit: Payer: Self-pay | Admitting: Physician Assistant

## 2020-09-25 ENCOUNTER — Ambulatory Visit: Payer: Self-pay | Admitting: Physician Assistant

## 2020-09-25 ENCOUNTER — Other Ambulatory Visit: Payer: Self-pay

## 2020-09-25 DIAGNOSIS — F411 Generalized anxiety disorder: Secondary | ICD-10-CM

## 2020-09-25 DIAGNOSIS — Z113 Encounter for screening for infections with a predominantly sexual mode of transmission: Secondary | ICD-10-CM

## 2020-09-25 LAB — GRAM STAIN

## 2020-09-26 ENCOUNTER — Other Ambulatory Visit: Payer: Self-pay

## 2020-09-26 ENCOUNTER — Emergency Department
Admission: EM | Admit: 2020-09-26 | Discharge: 2020-09-26 | Disposition: A | Discharge status: Home / Self Care | Payer: Self-pay | Attending: Emergency Medicine | Admitting: Emergency Medicine

## 2020-09-26 ENCOUNTER — Emergency Department: Payer: Self-pay

## 2020-09-26 ENCOUNTER — Encounter: Payer: Self-pay | Admitting: Physician Assistant

## 2020-09-26 DIAGNOSIS — Z87891 Personal history of nicotine dependence: Secondary | ICD-10-CM | POA: Insufficient documentation

## 2020-09-26 DIAGNOSIS — R1084 Generalized abdominal pain: Secondary | ICD-10-CM

## 2020-09-26 DIAGNOSIS — K279 Peptic ulcer, site unspecified, unspecified as acute or chronic, without hemorrhage or perforation: Secondary | ICD-10-CM | POA: Insufficient documentation

## 2020-09-26 DIAGNOSIS — R197 Diarrhea, unspecified: Secondary | ICD-10-CM | POA: Insufficient documentation

## 2020-09-26 LAB — COMPREHENSIVE METABOLIC PANEL
ALT: 19 U/L (ref 0–44)
AST: 22 U/L (ref 15–41)
Albumin: 4.6 g/dL (ref 3.5–5.0)
Alkaline Phosphatase: 55 U/L (ref 38–126)
Anion gap: 9 (ref 5–15)
BUN: 7 mg/dL (ref 6–20)
CO2: 25 mmol/L (ref 22–32)
Calcium: 9.7 mg/dL (ref 8.9–10.3)
Chloride: 106 mmol/L (ref 98–111)
Creatinine, Ser: 1.03 mg/dL (ref 0.61–1.24)
GFR, Estimated: >60 mL/min (ref >60)
Glucose, Bld: 119 mg/dL — ABNORMAL HIGH (ref 70–99)
Potassium: 4.1 mmol/L (ref 3.5–5.1)
Sodium: 140 mmol/L (ref 135–145)
Total Bilirubin: 0.6 mg/dL (ref 0.3–1.2)
Total Protein: 7.6 g/dL (ref 6.5–8.1)

## 2020-09-26 LAB — LIPASE, BLOOD: Lipase: 35 U/L (ref 11–51)

## 2020-09-26 LAB — CBC
HCT: 45.1 % (ref 39.0–52.0)
Hemoglobin: 14.7 g/dL (ref 13.0–17.0)
MCH: 28.9 pg (ref 26.0–34.0)
MCHC: 32.6 g/dL (ref 30.0–36.0)
MCV: 88.8 fL (ref 80.0–100.0)
Platelets: 292 10*3/uL (ref 150–400)
RBC: 5.08 MIL/uL (ref 4.22–5.81)
RDW: 13.4 % (ref 11.5–15.5)
WBC: 9 10*3/uL (ref 4.0–10.5)
nRBC: 0 % (ref 0.0–0.2)

## 2020-09-26 LAB — URINALYSIS, COMPLETE (UACMP) WITH MICROSCOPIC
Bacteria, UA: NONE SEEN
Bilirubin Urine: NEGATIVE
Glucose, UA: NEGATIVE mg/dL
Hgb urine dipstick: NEGATIVE
Ketones, ur: NEGATIVE mg/dL
Leukocytes,Ua: NEGATIVE
Nitrite: NEGATIVE
Protein, ur: NEGATIVE mg/dL
Specific Gravity, Urine: 1.014 (ref 1.005–1.030)
pH: 9 — ABNORMAL HIGH (ref 5.0–8.0)

## 2020-09-26 MED ORDER — DICYCLOMINE HCL 10 MG PO CAPS
10.0000 mg | ORAL_CAPSULE | Freq: Once | ORAL | Status: AC
  Administered 2020-09-26: 10 mg via ORAL
  Filled 2020-09-26: qty 1

## 2020-09-26 MED ORDER — DICYCLOMINE HCL 10 MG/ML IM SOLN: 20.0000 mg | Freq: Once | INTRAMUSCULAR | Status: DC

## 2020-09-26 MED ORDER — ONDANSETRON HCL 4 MG/2ML IJ SOLN
4.0000 mg | Freq: Once | INTRAMUSCULAR | Status: AC
  Administered 2020-09-26: 4 mg via INTRAVENOUS
  Filled 2020-09-26: qty 2

## 2020-09-26 MED ORDER — DICYCLOMINE HCL 10 MG PO CAPS: 10.0000 mg | ORAL_CAPSULE | Freq: Four times a day (QID) | ORAL | 0 refills | Status: AC

## 2020-09-26 MED ORDER — FENTANYL CITRATE (PF) 100 MCG/2ML IJ SOLN
50.0000 ug | Freq: Once | INTRAMUSCULAR | Status: AC
  Administered 2020-09-26: 50 ug via INTRAVENOUS
  Filled 2020-09-26: qty 2

## 2020-09-26 MED ORDER — PANTOPRAZOLE SODIUM 40 MG PO TBEC: 40.0000 mg | DELAYED_RELEASE_TABLET | Freq: Every day | ORAL | 1 refills | Status: AC

## 2020-09-26 MED ORDER — FAMOTIDINE IN NACL 20-0.9 MG/50ML-% IV SOLN: 20.0000 mg | Freq: Once | INTRAVENOUS | Status: DC

## 2020-09-26 MED ORDER — IOHEXOL 300 MG/ML  SOLN
100.0000 mL | Freq: Once | INTRAMUSCULAR | Status: AC | PRN
  Administered 2020-09-26: 100 mL via INTRAVENOUS
  Filled 2020-09-26: qty 100

## 2020-09-26 MED ORDER — ONDANSETRON 4 MG PO TBDP
4.0000 mg | ORAL_TABLET | Freq: Once | ORAL | Status: AC | PRN
  Administered 2020-09-26: 4 mg via ORAL
  Filled 2020-09-26: qty 1
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
## 2020-09-26 NOTE — Discharge Instructions (Signed)
Follow-up with GI if continued dark stools and if not improving in 2 to 3 days. Return to the emergency department if worsening Take your medications as prescribed

## 2020-09-26 NOTE — ED Notes (Signed)
Patient transported to CT 

## 2020-09-26 NOTE — ED Provider Notes (Signed)
[facility] Emergency Department Provider Note  ____________________________________________   Event Date/Time   First MD Initiated Contact with Patient 09/26/20 1421     (approximate)  I have reviewed the triage vital signs and the nursing notes.   HISTORY  Chief Complaint Abdominal Pain    HPI Jeremy Kim is a 25 y.o. male presents emergency department complaining of lower abdominal pain, vomiting x3 today, and diarrhea with dark stools.  Patient states he has had intermittent abdominal pain for about a week.  Worsening today.  Patient still has gallbladder and appendix.  He denies any fever or chills.    Past Medical History:  Diagnosis Date  . Anxiety     Patient Active Problem List   Diagnosis Date Noted  . Foreign body of left knee 12/19/2019  . 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 Generalized anxiety disorder 02/16/2019    Past Surgical History:  Procedure Laterality Date  . FOREIGN BODY REMOVAL Left 12/26/2019   Procedure: FOREIGN BODY REMOVAL LEFT KNEE;  Surgeon: Kathryne Hitch, MD;  Location: [facility] OR;  Service: Orthopedics;  Laterality: Left;  . NO PAST SURGERIES      Prior to Admission medications   Medication Sig Start Date End Date Taking? Authorizing Provider  dicyclomine (BENTYL) 10 MG capsule Take 1 capsule (10 mg total) by mouth 4 (four) times daily for 14 days. 09/26/20 10/10/20 Yes Hibo Blasdell, Roselyn Bering, PA-C  pantoprazole (PROTONIX) 40 MG tablet Take 1 tablet (40 mg total) by mouth daily. 09/26/20 09/26/21 Yes Mithra Spano, Roselyn Bering, PA-C  acetaminophen (TYLENOL) 650 MG CR tablet Take 1,300 mg by mouth every 8 (eight) hours as needed for pain.    [provider]  HYDROcodone-acetaminophen (NORCO/VICODIN) 5-325 MG tablet Take 1-2 tablets by mouth every 6 (six) hours as needed for moderate pain. Patient not taking: Reported on 03/18/2020 12/26/19   Kathryne Hitch, MD  ibuprofen (ADVIL) 800  MG tablet Take 800 mg by mouth every 8 (eight) hours as needed.    [provider]  Multiple Vitamins-Minerals (MULTIVITAMIN WITH MINERALS) tablet Take 1 tablet by mouth daily.    [provider]  traMADol (ULTRAM) 50 MG tablet Take 1-2 tablets (50-100 mg total) by mouth every 6 (six) hours as needed. Patient not taking: Reported on 03/18/2020 12/28/19   Kathryne Hitch, MD    Allergies Hydrocodone  History reviewed. No pertinent family history.  Social History Social History   Tobacco Use  . Smoking status: Former Smoker    Types: Cigarettes  . Smokeless tobacco: Never Used  Vaping Use  . Vaping Use: Never used  Substance Use Topics  . Alcohol use: No  . Drug use: Yes    Frequency: 4.0 times per week    Types: Marijuana    Review of Systems  Constitutional: No fever/chills Eyes: No visual changes. ENT: No sore throat. Respiratory: Denies cough Cardiovascular: Denies chest pain Gastrointestinal: Positive abdominal pain Genitourinary: Negative for dysuria. Musculoskeletal: Negative for back pain. Skin: Negative for rash. Psychiatric: no mood changes,     ____________________________________________   PHYSICAL EXAM:  VITAL SIGNS: ED Triage Vitals  Enc Vitals Group     BP 09/26/20 1225 (!) 141/92     Pulse Rate 09/26/20 1225 86     Resp 09/26/20 1225 20     Temp 09/26/20 1225 98.4 F (36.9 C)     Temp Source 09/26/20 1225 Oral     SpO2 09/26/20 1225 98 %  Weight 09/26/20 1224 215 lb (97.5 kg)     Height 09/26/20 1224 6\' 2"  (1.88 m)     Head Circumference --      Peak Flow --      Pain Score 09/26/20 1224 7     Pain Loc --      Pain Edu? --      Excl. in GC? --     Constitutional: Alert and oriented. Well appearing and in no acute distress. Eyes: Conjunctivae are normal.  Head: Atraumatic. Nose: No congestion/rhinnorhea. Mouth/Throat: Mucous membranes are moist.   Neck:  supple no lymphadenopathy noted Cardiovascular:  Normal rate, regular rhythm. Heart sounds are normal Respiratory: Normal respiratory effort.  No retractions, lungs c t a  Abd: soft tender in right lower and right upper quadrant, bs normal all 4 quad GU: deferred Musculoskeletal: FROM all extremities, warm and well perfused Neurologic:  Normal speech and language.  Skin:  Skin is warm, dry and intact. No rash noted. Psychiatric: Mood and affect are normal. Speech and behavior are normal.  ____________________________________________   LABS (all labs ordered are listed, but only abnormal results are displayed)  Labs Reviewed  COMPREHENSIVE METABOLIC PANEL - Abnormal; Notable for the following components:      Result Value   Glucose, Bld 119 (*)    All other components within normal limits  URINALYSIS, COMPLETE (UACMP) WITH MICROSCOPIC - Abnormal; Notable for the following components:   Color, Urine YELLOW (*)    APPearance CLEAR (*)    pH 9.0 (*)    All other components within normal limits  LIPASE, BLOOD  CBC   ____________________________________________   ____________________________________________  RADIOLOGY  CT abdomen/pelvis IV contrast  ____________________________________________   PROCEDURES  Procedure(s) performed: No  Procedures    ____________________________________________   INITIAL IMPRESSION / ASSESSMENT AND PLAN / ED COURSE  Pertinent labs & imaging results that were available during my care of the patient were reviewed by me and considered in my medical decision making (see chart for details).   Patient is a 25 year old male presents with abdominal pain.  See HPI.  Physical exam shows patient appears stable  DDx: Acute appendicitis, acute cholecystitis, kidney stone, gastroenteritis, colitis  Labs are reassuring, CBC, comprehensive metabolic panel, lipase and urinalysis all appear to be normal  CT abdomen pelvis with IV contrast for   CT abdomen/pelvis reviewed by me confirmed by  radiology to be negative for any acute abnormality  I did explain all of the findings to the patient.  Due to the dark stools with abdominal pain did treat him for peptic ulcer disease along with abdominal spasms.  He was given a prescription for Protonix and Bentyl.  He is given instructions to follow-up with GI.  Return emergency department worsening.  Is discharged stable condition.  Earlin Sweeden was evaluated in Emergency Department on 09/26/2020 for the symptoms described in the history of present illness. He was evaluated in the context of the global COVID-19 pandemic, which necessitated consideration that the patient might be at risk for infection with the SARS-CoV-2 virus that causes COVID-19. Institutional protocols and algorithms that pertain to the evaluation of patients at risk for COVID-19 are in a state of rapid change based on information released by regulatory bodies including the CDC and federal and state organizations. These policies and algorithms were followed during the patient's care in the ED.    As part of my medical decision making, I reviewed the following data within the electronic MEDICAL RECORD NUMBER  Nursing notes reviewed and incorporated, Labs reviewed , Old chart reviewed, Radiograph reviewed , Notes from prior ED visits and [place] Controlled Substance Database  ____________________________________________   FINAL CLINICAL IMPRESSION(S) / ED DIAGNOSES  Final diagnoses:  PUD (peptic ulcer disease)  Generalized abdominal pain      NEW MEDICATIONS STARTED DURING THIS VISIT:  New Prescriptions   DICYCLOMINE (BENTYL) 10 MG CAPSULE    Take 1 capsule (10 mg total) by mouth 4 (four) times daily for 14 days.   PANTOPRAZOLE (PROTONIX) 40 MG TABLET    Take 1 tablet (40 mg total) by mouth daily.     Note:  This document was prepared using Dragon voice recognition software and may include unintentional dictation errors.    Faythe Ghee, PA-C 09/26/20 1642    Gilles Chiquito, MD 09/26/20 [phone]

## 2020-09-26 NOTE — ED Notes (Signed)
Discharge instructions reviewed with pt. Pt calm , collective , denied pain or sob, ambulatory upon discharge.

## 2020-09-26 NOTE — ED Triage Notes (Signed)
Pt with lower abdominal pain x 7 days, pt states it is worse today and pt was experiencing N/V. Pt with tenderness to lower abdominal/suprapubic area, denies rebound tenderness. Pt denies pain with urination or blood in urine.

## 2020-09-29 LAB — GONOCOCCUS CULTURE

## 2020-10-01 LAB — HM HIV SCREENING LAB: HM HIV Screening: NEGATIVE
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
## 2022-12-31 ENCOUNTER — Encounter: Payer: Self-pay | Admitting: Family Medicine

## 2022-12-31 ENCOUNTER — Ambulatory Visit: Payer: Self-pay | Admitting: Family Medicine

## 2022-12-31 DIAGNOSIS — Z5900 Homelessness unspecified: Secondary | ICD-10-CM

## 2022-12-31 DIAGNOSIS — A749 Chlamydial infection, unspecified: Secondary | ICD-10-CM

## 2022-12-31 DIAGNOSIS — B356 Tinea cruris: Secondary | ICD-10-CM

## 2022-12-31 DIAGNOSIS — Z113 Encounter for screening for infections with a predominantly sexual mode of transmission: Secondary | ICD-10-CM

## 2022-12-31 DIAGNOSIS — F419 Anxiety disorder, unspecified: Secondary | ICD-10-CM

## 2022-12-31 LAB — GRAM STAIN

## 2022-12-31 LAB — HM HIV SCREENING LAB: HM HIV Screening: NEGATIVE

## 2022-12-31 MED ORDER — DOXYCYCLINE HYCLATE 100 MG PO TABS: 100.0000 mg | ORAL_TABLET | Freq: Two times a day (BID) | ORAL | Status: AC

## 2022-12-31 NOTE — Progress Notes (Signed)
Pt is here for STD screening.  Gram stain results reviewed. The patient was dispensed Doxycycline 100 mg #14 and Clotrimazole Topical cream #1 today. I provided counseling today regarding the medication. We discussed the medication, the side effects and when to call clinic. Patient given the opportunity to ask questions. Questions answered.

## 2022-12-31 NOTE — Progress Notes (Signed)
[facility] Department STI clinic/screening visit  Subjective:  Jeremy Kim is a 27 y.o. male being seen today for an STI screening visit. The patient reports they do have symptoms.    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  Pt has a rash in his groin area and "throbbing in the head of my penis"    HPI  Patient reports to clinic with complaint of throbbing at head of penis and intermittent itching near groin and thigh. Used jock itch spray with some relief  Last HIV test per patient/review of record was  Lab Results  Component Value Date   HMHIVSCREEN Negative - Validated 10/01/2020   No results found for: "HIV"  Does the patient or their partner desires a pregnancy in the next year? No  Screening for MPX risk: Does the patient have an unexplained rash? No Is the patient MSM? No Does the patient endorse multiple sex partners or anonymous sex partners? No Did the patient have close or sexual contact with a person diagnosed with MPX? No Has the patient traveled outside the [place] where MPX is endemic? No Is there a high clinical suspicion for MPX-- evidenced by one of the following No  -Unlikely to be chickenpox  -Lymphadenopathy  -Rash that present in same phase of evolution on any given body part   See flowsheet for further details and programmatic requirements.   Immunization History  Administered Date(s) Administered   Tdap 12/02/2019     The following portions of the patient's history were reviewed and updated as appropriate: allergies, current medications, past medical history, past social history, past surgical history and problem list.  Objective:  There  were no vitals filed for this visit.  Physical Exam Constitutional:      Appearance: Normal appearance.  HENT:     Head: Normocephalic and atraumatic.     Comments: No nits or hair loss    Mouth/Throat:     Mouth: Mucous membranes are moist. No oral lesions.     Pharynx: Oropharynx is clear. No oropharyngeal exudate or posterior oropharyngeal erythema.  Eyes:     General:        Right eye: No discharge.        Left eye: No discharge.     Conjunctiva/sclera:     Right eye: Right conjunctiva is not injected. No exudate.    Left eye: Left conjunctiva is not injected. No exudate. Pulmonary:     Effort: Pulmonary effort is normal.  Abdominal:     General: Abdomen is flat.     Palpations: Abdomen is soft. There is no hepatomegaly or mass.     Tenderness: There is no abdominal tenderness. There is no rebound.     Hernia: There is no hernia in the left inguinal area or right inguinal area.  Genitourinary:    Pubic Area: No rash or pubic lice (no nits).      Penis: Normal. No tenderness, discharge, swelling or lesions.      Testes: Normal.     Epididymis:     Right: Normal. No mass or tenderness.     Left: Normal. No mass or tenderness.     Rectum: Normal.  No tenderness (no lesions or discharge).       Comments: Penile Discharge Amount: none Color:  none   Hyperpigmentation on right thigh Lymphadenopathy:     Head:     Right side of head: Submandibular adenopathy present. No preauricular or posterior auricular adenopathy.     Left side of head: Submandibular adenopathy present. No preauricular or posterior auricular adenopathy.     Cervical: No cervical adenopathy.     Upper Body:     Right upper body: No supraclavicular, axillary or epitrochlear adenopathy.     Left upper body: No supraclavicular, axillary or epitrochlear adenopathy.     Lower Body: No right inguinal adenopathy. No left inguinal adenopathy.  Skin:    General: Skin is warm and dry.     Findings: No lesion  or rash.  Neurological:     Mental Status: He is alert and oriented to person, place, and time.       Assessment and Plan:  Jeremy Kim is a 27 y.o. male presenting to the [facility] Department for STI screening  1. Screening for venereal disease  - HIV [place] [facility] - Syphilis Serology,  Lab - Chlamydia/GC NAA, Confirmation - Gram stain  2. Anxiety  - Ambulatory referral to Behavioral Health  3. Homeless -given resource list   Patient does have STI symptoms Patient accepted all screenings including  urine GC/Chlamydia, and blood work for HIV/Syphilis. Patient meets criteria for HepB screening? Yes. Ordered? Practitioner oversight- forgot to order Patient meets criteria for HepC screening? No. Ordered? not applicable Recommended condom use with all sex Discussed importance of condom use for STI prevent  Treat positive test results per standing order. Discussed time line for State Lab results and that patient will be called with positive results and encouraged patient to call if he had not heard in 2 weeks Recommended repeat testing in 3 months with positive results. Recommended returning for continued or worsening symptoms.   Return if symptoms worsen or fail to improve, for STI screening. Total time spent 20 minutes  No future appointments.  Lenice Llamas, [place]

## 2023-01-01 MED ORDER — CLOTRIMAZOLE-BETAMETHASONE 1-0.05 % EX CREA: 1.0000 | TOPICAL_CREAM | Freq: Every day | CUTANEOUS | 0 refills | Status: DC

## 2023-01-01 MED ORDER — CLOTRIMAZOLE-BETAMETHASONE 1-0.05 % EX CREA: 1.0000 | TOPICAL_CREAM | Freq: Every day | CUTANEOUS | Status: AC

## 2023-01-01 NOTE — Addendum Note (Signed)
Addended by: Lenice Llamas on: 01/01/2023 11:48 AM   Modules accepted: Orders

## 2023-04-26 ENCOUNTER — Ambulatory Visit: Payer: Self-pay | Admitting: Family Medicine

## 2023-04-26 DIAGNOSIS — Z113 Encounter for screening for infections with a predominantly sexual mode of transmission: Secondary | ICD-10-CM

## 2023-04-26 LAB — HM HIV SCREENING LAB: HM HIV Screening: NEGATIVE

## 2023-04-26 NOTE — Progress Notes (Signed)
PT is here for std screening, condoms declined. Gaspar Garbe, RN

## 2023-04-26 NOTE — Progress Notes (Signed)
[facility] Department STI clinic/screening visit  Subjective:  Court Deister is a 27 y.o. male being seen today for an STI screening visit. The patient reports they do not have symptoms.    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   Generalized anxiety disorder 02/16/2019     Chief Complaint  Patient presents with   SEXUALLY TRANSMITTED DISEASE    No symptoms    HPI  Patient reports  to clinic for STI testing- asymptomatic  Last HIV test per patient/review of record was  Lab Results  Component Value Date   HMHIVSCREEN Negative - Validated 12/31/2022   No results found for: "HIV"  Last HEPC test per patient/review of record was No results found for: "HMHEPCSCREEN" No components found for: "HEPC"   Last HEPB test per patient/review of record was No components found for: "HMHEPBSCREEN" No components found for: "HEPC"   Does the patient or their partner desires a pregnancy in the next year? No  Screening for MPX risk: Does the patient have an unexplained rash? No Is the patient MSM? No Does the patient endorse multiple sex partners or anonymous sex partners? No Did the patient have close or sexual contact with a person diagnosed with MPX? No Has the patient traveled outside the [place] where MPX is endemic? No Is there a high clinical suspicion for MPX-- evidenced by one of the following No  -Unlikely to be chickenpox  -Lymphadenopathy  -Rash that present in same phase of evolution on any given body part   See flowsheet for further details and programmatic requirements.   Immunization History  Administered Date(s) Administered   Tdap 12/02/2019     The following portions of the patient's history were reviewed and updated as appropriate: allergies, current medications, past medical history, past  social history, past surgical history and problem list.  Objective:  There were no vitals filed for this visit.  Physical Exam Vitals and nursing note reviewed.  Constitutional:      Appearance: Normal appearance.  HENT:     Head: Normocephalic and atraumatic.     Mouth/Throat:     Mouth: Mucous membranes are moist.     Pharynx: No oropharyngeal exudate or posterior oropharyngeal erythema.  Eyes:     General:        Right eye: No discharge.        Left eye: No discharge.     Conjunctiva/sclera:     Right eye: Right conjunctiva is not injected. No exudate.    Left eye: Left conjunctiva is not injected. No exudate. Neck:     Comments: Patient consistently has swollen lymph nodes Pulmonary:     Effort: Pulmonary effort is normal.  Abdominal:     General: Abdomen is flat.     Palpations: Abdomen is soft. There is no hepatomegaly or mass.     Tenderness: There is no abdominal tenderness. There is no rebound.  Genitourinary:    Comments: Declined genital exam- asymptomatic Lymphadenopathy:     Cervical: Cervical adenopathy present.     Upper Body:     Right upper body: No supraclavicular or axillary adenopathy.     Left upper body: No supraclavicular or axillary adenopathy.  Skin:    General: Skin is warm and dry.  Neurological:     Mental Status: He is alert and  oriented to person, place, and time.       Assessment and Plan:  Carry Mcrea is a 27 y.o. male presenting to the [facility] Department for STI screening  1. Screening for venereal disease  - HIV [place] [facility] - Syphilis Serology, [place] [facility] - Chlamydia/GC NAA, Confirmation   Patient does not have STI symptoms Patient accepted all screenings including  urine GC/Chlamydia, and blood work for HIV/Syphilis. Patient meets criteria for HepB screening? No. Ordered? not applicable Patient meets criteria for HepC screening? No. Ordered? not applicable Recommended condom use with all sex Discussed  importance of condom use for STI prevent  Treat positive test results per standing order. Discussed time line for State Lab results and that patient will be called with positive results and encouraged patient to call if he had not heard in 2 weeks Recommended repeat testing in 3 months with positive results. Recommended returning for continued or worsening symptoms.   Return if symptoms worsen or fail to improve, for STI screening.  No future appointments.  Lenice Llamas, [place]

## 2023-04-28 LAB — CHLAMYDIA/GC NAA, CONFIRMATION
Chlamydia trachomatis, NAA: NEGATIVE
Neisseria gonorrhoeae, NAA: NEGATIVE

## 2023-10-04 ENCOUNTER — Ambulatory Visit: Payer: Self-pay

## 2023-10-25 ENCOUNTER — Encounter: Payer: Self-pay | Admitting: Family Medicine

## 2023-10-25 ENCOUNTER — Ambulatory Visit: Payer: Self-pay | Admitting: Family Medicine

## 2023-10-25 DIAGNOSIS — Z113 Encounter for screening for infections with a predominantly sexual mode of transmission: Secondary | ICD-10-CM

## 2023-10-25 DIAGNOSIS — N341 Nonspecific urethritis: Secondary | ICD-10-CM

## 2023-10-25 LAB — GRAM STAIN

## 2023-10-25 LAB — HM HIV SCREENING LAB: HM HIV Screening: NEGATIVE

## 2023-10-25 MED ORDER — DOXYCYCLINE HYCLATE 100 MG PO TABS: 100.0000 mg | ORAL_TABLET | Freq: Two times a day (BID) | ORAL | Status: AC

## 2023-10-25 NOTE — Addendum Note (Signed)
 Addended by: Earleen Glazier on: 10/25/2023 09:11 AM   Modules accepted: Orders

## 2023-10-25 NOTE — Progress Notes (Signed)
 Pt here for STI screening.  Gram stain result reviewed with patient.  Result positive for NGU.  Doxycycline  100mg  #14 dispensed to patient.  Counseled re medication, side effects, plan of care and when to contact clinic with questions or concerns.  Verbalizes understanding.  NGU pamphlet given.  Condoms provided.  Contact card x1.  Report card completed.-Albany Winslow, RN

## 2023-10-25 NOTE — Progress Notes (Deleted)
 Pt here for STI screening.  Gram stain result reviewed with patient.  Result positive for NGU.  Doxycycline  100mg  #14 dispensed to pt.  Counseled re medication, side effects, plan of care and when to contact clinic

## 2023-10-25 NOTE — Progress Notes (Signed)
 [facility] Department STI clinic [address] [place] [address] Main phone: [phone]  STI screening visit  Subjective:  Jeremy Kim is a 28 y.o. male being seen today for an STI screening visit. The patient reports they do have symptoms.    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 State that this only happens every once in a while, and then it goes away. Reports that the last time he came it he had had the same symptoms and the urine GC/CT came back negative.  See flowsheet for further details and programmatic requirements  Hyperlink available at the top of the signed note in blue.  Flow sheet content below:  Pregnancy Intention Screening Does the patient want to become pregnant in the next year?: No Does the patient's partner want to become pregnant in the next year?: No Would the patient like to discuss contraceptive options today?: N/A All Patients Anyone smoke around pt and/or pt's children?: Yes Anyone smoke inside pt's house?: No Anyone smoke inside car?: No Anyone smoke inside the workplace?: No Reason For STD Screen STD Screening: Has symptoms History of Antibiotic use in the past 2 weeks?: No STD Symptoms Other (Describe in Comments): Yes Other s/s: tingling at head of penis Risk Factors for Hep B Household, sexual, or needle sharing contact of a person infected with Hep B: No Sexual contact with a person who uses drugs not as prescribed?: No Currently or Ever used drugs not as prescribed: No HIV Positive: No PRep Patient: No Men who have sex with men:  N/A Have Hepatitis C: No History of Incarceration: No History of Homeslessness?: No Anal sex following anal drug use?: No Risk Factors for Hep C Currently using drugs not as prescribed: No Sexual partner(s) currently using drugs as not prescribed: No History of drug use: No HIV Positive: No People with a history of incarceration: No People born between the years of [birth year] and [birth year]: No Advise Advised client to quit or stay quit. : Yes (given quit line information) Assess Is patient ready to quit in next 30 days?: No Abuse History Has patient ever been abused physically?: No Has patient ever been abused sexually?: No Does patient feel they have a problem with Anxiety?: Yes Does patient feel they have a problem with Depression?: No Referral to Behavioral Health: Declined Counseling Patient counseled to use condoms with all sex: Condoms declined RTC in 2-3 weeks for test results: Yes Clinic will call if test results abnormal before test result appt.: Yes Test results given to patient Patient counseled to use condoms with all sex: Condoms declined  Screening for MPX risk: Does the patient have an unexplained rash? No Is the patient MSM? No Does the patient endorse multiple sex partners or anonymous sex partners? No Did the patient have close or sexual contact with a person diagnosed with MPX? No Has the patient traveled outside the [place]  where MPX is endemic? No Is there a high clinical suspicion for MPX-- evidenced by one of the following No  -Unlikely to be chickenpox  -Lymphadenopathy  -Rash that present in same phase of evolution on any  given body part  STI screening history: Last HIV test per patient/review of record was  Lab Results  Component Value Date   HMHIVSCREEN Negative - Validated 04/26/2023   No results found for: "HIV"  Last HEPC test per patient/review of record was No results found for: "HMHEPCSCREEN" No components found for: "HEPC"   Last HEPB test per  patient/review of record was No components found for: "HMHEPBSCREEN"   Fertility: Does the patient or their partner desires a pregnancy in the next year? No  Immunization History  Administered Date(s) Administered   Tdap 12/02/2019    The following portions of the patient's history were reviewed and updated as appropriate: allergies, current medications, past medical history, past social history, past surgical history and problem list.  Objective:  There were no vitals filed for this visit.  Physical Exam Exam conducted with a chaperone present Concha Deed B RN).  Constitutional:      Appearance: Normal appearance.  HENT:     Head: Normocephalic and atraumatic.     Comments: No nits or hair loss    Mouth/Throat:     Mouth: Mucous membranes are moist. No oral lesions.     Pharynx: Oropharynx is clear. No oropharyngeal exudate or posterior oropharyngeal erythema.  Eyes:     General:        Right eye: No discharge.        Left eye: No discharge.     Conjunctiva/sclera:     Right eye: Right conjunctiva is not injected. No exudate.    Left eye: Left conjunctiva is not injected. No exudate. Pulmonary:     Effort: Pulmonary effort is normal.  Abdominal:     General: Abdomen is flat.     Palpations: Abdomen is soft. There is no hepatomegaly or mass.     Tenderness: There is no abdominal tenderness. There is no rebound.     Hernia: There is no hernia in the left inguinal area or right inguinal area.  Genitourinary:    Pubic Area: No rash or pubic lice (no nits).      Penis: Normal. No tenderness, discharge, swelling or lesions.      Testes: Normal.     Epididymis:     Right: Normal. No mass or tenderness.     Left: Normal. No mass or tenderness.     Rectum: Normal. No tenderness (no lesions or discharge).     Comments: Penile Discharge Amount: none Color:  none Lymphadenopathy:     Head:     Right side of head: No preauricular or posterior auricular adenopathy.     Left side of  head: No preauricular or posterior auricular adenopathy.     Cervical: No cervical adenopathy.     Upper Body:     Right upper body: No supraclavicular, axillary or epitrochlear adenopathy.     Left upper body: No supraclavicular, axillary or epitrochlear adenopathy.     Lower Body: No right inguinal adenopathy. No left inguinal adenopathy.  Skin:    General: Skin is warm and dry.     Findings: No lesion or rash.  Neurological:     Mental Status: He is alert and oriented to person, place, and time.      Assessment and Plan:  Kentley Blyden is a 28 y.o. male presenting to the [facility] Department for STI screening  1. Screening for venereal disease (Primary)  - HIV [place] [facility] - Syphilis Serology, [facility] - Chlamydia/GC NAA, Confirmation - Gram  stain   Patient does have STI symptoms Patient accepted the following screenings: HIV and RPR Patient meets criteria for HepB screening? No. Ordered? not applicable Patient meets criteria for HepC screening? No. Ordered? not applicable Recommended condom use with all sex Discussed importance of condom use for STI prevention  Treat positive test results per standing order. Discussed time line for State Lab results and that patient will be called with positive results and encouraged patient to call if he had not heard in 2 weeks Recommended repeat testing in 3 months with positive results. Recommended returning for continued or worsening symptoms.   Return if symptoms worsen or fail to improve, for STI screening.  No future appointments.  Earleen Glazier, [place]

## 2023-10-27 LAB — CHLAMYDIA/GC NAA, CONFIRMATION
Chlamydia trachomatis, NAA: NEGATIVE
Neisseria gonorrhoeae, NAA: NEGATIVE

## 2023-10-29 ENCOUNTER — Encounter: Payer: Self-pay | Admitting: Family Medicine

## 2024-03-15 ENCOUNTER — Ambulatory Visit: Payer: Self-pay

## 2024-03-15 DIAGNOSIS — R4586 Emotional lability: Secondary | ICD-10-CM

## 2024-03-15 DIAGNOSIS — Z113 Encounter for screening for infections with a predominantly sexual mode of transmission: Secondary | ICD-10-CM

## 2024-03-15 DIAGNOSIS — N341 Nonspecific urethritis: Secondary | ICD-10-CM

## 2024-03-15 LAB — GRAM STAIN

## 2024-03-15 LAB — HM HIV SCREENING LAB: HM HIV Screening: NEGATIVE

## 2024-03-15 MED ORDER — DOXYCYCLINE HYCLATE 100 MG PO TABS: 100.0000 mg | ORAL_TABLET | Freq: Two times a day (BID) | ORAL | Status: AC

## 2024-03-15 NOTE — Progress Notes (Signed)
 Pt is here for STD screening. Gram stain(-) and WBC>2hpf.The patient was dispensed doxycyline 100 mg capsules 2x/day for 7 days. I provided counseling today regarding the medication, the side effects and when to call clinic. Patient was given the opportunity to ask questions for any clarifications. Questions answered. Brochure given and condoms declined. Wilkie Drought, RN.
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
## 2024-03-18 LAB — CHLAMYDIA/GC NAA, CONFIRMATION
Chlamydia trachomatis, NAA: NEGATIVE
Neisseria gonorrhoeae, NAA: NEGATIVE

## 2024-03-20 LAB — GONOCOCCUS CULTURE
